# Patient Record
Sex: Female | Born: 1990 | Race: White | Hispanic: Yes | Marital: Single | Smoking: Never smoker
Health system: Southern US, Community
[De-identification: ages and names within clinical notes are randomized; demographics above are authoritative.]

## PROBLEM LIST (undated history)

## (undated) DIAGNOSIS — Z789 Other specified health status: Secondary | ICD-10-CM

## (undated) HISTORY — PX: NO PAST SURGERIES: SHX2092

---

## 2020-02-26 LAB — OB RESULTS CONSOLE GC/CHLAMYDIA
Chlamydia: NEGATIVE
Gonorrhea: NEGATIVE

## 2020-02-26 LAB — OB RESULTS CONSOLE RPR: RPR: NONREACTIVE

## 2020-02-26 LAB — OB RESULTS CONSOLE RUBELLA ANTIBODY, IGM: Rubella: IMMUNE

## 2020-02-26 LAB — OB RESULTS CONSOLE HIV ANTIBODY (ROUTINE TESTING): HIV: NONREACTIVE

## 2020-02-26 LAB — OB RESULTS CONSOLE HEPATITIS B SURFACE ANTIGEN: Hepatitis B Surface Ag: NEGATIVE

## 2020-02-29 ENCOUNTER — Inpatient Hospital Stay (HOSPITAL_COMMUNITY): Admit: 2020-02-29 | Payer: 59 | Admitting: Obstetrics and Gynecology

## 2020-06-14 NOTE — L&D Delivery Note (Signed)
Delivery Note At 11:31 AM a viable female was delivered via Vaginal, Spontaneous (Presentation:   Occiput Anterior).  APGAR: 9, 9; weight  .   Placenta status: Spontaneous, Intact.  Cord: 3 vessels with the following complications: None.  Cord pH: n/a  Anesthesia: Epidural but not working well Episiotomy: None Lacerations:  1st Suture Repair: 3.0 vicryl rapide 2 separate figure of 8 sutures Est. Blood Loss (mL):  150cc  Mom to postpartum.  Baby to Couplet care / Skin to Skin.  Lendon Colonel 09/14/2020, 11:50 AM

## 2020-09-13 ENCOUNTER — Inpatient Hospital Stay (HOSPITAL_COMMUNITY)
Admission: AD | Admit: 2020-09-13 | Discharge: 2020-09-16 | DRG: 807 | Disposition: A | Discharge status: Home / Self Care | Payer: Medicaid Other | Attending: Obstetrics | Admitting: Obstetrics

## 2020-09-13 ENCOUNTER — Other Ambulatory Visit: Payer: Self-pay

## 2020-09-13 ENCOUNTER — Encounter (HOSPITAL_COMMUNITY): Payer: Self-pay | Admitting: Obstetrics

## 2020-09-13 DIAGNOSIS — O4202 Full-term premature rupture of membranes, onset of labor within 24 hours of rupture: Secondary | ICD-10-CM

## 2020-09-13 DIAGNOSIS — O4292 Full-term premature rupture of membranes, unspecified as to length of time between rupture and onset of labor: Principal | ICD-10-CM | POA: Diagnosis present

## 2020-09-13 DIAGNOSIS — Z3A38 38 weeks gestation of pregnancy: Secondary | ICD-10-CM

## 2020-09-13 DIAGNOSIS — Z3A39 39 weeks gestation of pregnancy: Secondary | ICD-10-CM | POA: Diagnosis not present

## 2020-09-13 DIAGNOSIS — Z20822 Contact with and (suspected) exposure to covid-19: Secondary | ICD-10-CM | POA: Diagnosis present

## 2020-09-13 HISTORY — DX: Other specified health status: Z78.9

## 2020-09-13 MED ORDER — LACTATED RINGERS IV SOLN: 500.0000 mL | INTRAVENOUS | Status: DC | PRN

## 2020-09-13 NOTE — MAU Provider Note (Signed)
S: Ms. Mahnoor Mathisen is a 30 y.o. G2P0010 at [redacted]w[redacted]d  who presents to MAU today complaining of leaking of fluid since 7pm after experiencing a "pop." She denies vaginal bleeding. She reports irregular contractions. She reports normal fetal movement.    O: BP 125/84 (BP Location: Right Arm)   Pulse 95   Temp 98.2 F (36.8 C) (Oral)   Resp 16   LMP  (Exact Date)   SpO2 100%  GENERAL: Well-developed, well-nourished female in no acute distress.  HEAD: Normocephalic, atraumatic.  CHEST: Normal effort of breathing, regular heart rate ABDOMEN: Soft, nontender, gravid PELVIC: Normal external female genitalia. Vagina is pink and rugated. Cervix with normal contour, no lesions. Normal discharge.  Neg pooling. Fern Collected.  Cervical exam:  Dilation: 1.5 Effacement (%): 60 Cervical Position: Posterior Station: -2 Presentation: Vertex Exam by:: DFM   Fetal Monitoring: FHT: 135 bpm, Mod Var, -Decels, +Accels Toco: Occasional ctx with irritability  No results found for this or any previous visit (from the past 24 hour(s)).   A: SIUP at [redacted]w[redacted]d  Cat I FT SROM  P: Fern positive Report given to RN to contact MD on call for further instructions.  Gerrit Heck, CNM 09/13/2020 11:25 PM

## 2020-09-13 NOTE — MAU Note (Signed)
Pt presents to MAU with c/o questionable ROM.  Pt reports that she felt a trickle of fluid around 7pm and had some red spots on tissue when wiped.  Pt reports that she heard a "pop".  +FM

## 2020-09-14 ENCOUNTER — Inpatient Hospital Stay (HOSPITAL_COMMUNITY): Payer: Medicaid Other | Admitting: Anesthesiology

## 2020-09-14 ENCOUNTER — Encounter (HOSPITAL_COMMUNITY): Payer: Self-pay | Admitting: Obstetrics

## 2020-09-14 LAB — TYPE AND SCREEN
ABO/RH(D): AB POS
Antibody Screen: NEGATIVE

## 2020-09-14 LAB — CBC
HCT: 43.1 % (ref 36.0–46.0)
Hemoglobin: 14.8 g/dL (ref 12.0–15.0)
MCH: 29.8 pg (ref 26.0–34.0)
MCHC: 34.3 g/dL (ref 30.0–36.0)
MCV: 86.9 fL (ref 80.0–100.0)
Platelets: 191 10*3/uL (ref 150–400)
RBC: 4.96 MIL/uL (ref 3.87–5.11)
RDW: 13.2 % (ref 11.5–15.5)
WBC: 8.4 10*3/uL (ref 4.0–10.5)
nRBC: 0 % (ref 0.0–0.2)

## 2020-09-14 LAB — RESP PANEL BY RT-PCR (FLU A&B, COVID) ARPGX2
Influenza A by PCR: NEGATIVE
Influenza B by PCR: NEGATIVE
SARS Coronavirus 2 by RT PCR: NEGATIVE

## 2020-09-14 LAB — RPR: RPR Ser Ql: NONREACTIVE

## 2020-09-14 MED ORDER — SIMETHICONE 80 MG PO CHEW: 80.0000 mg | CHEWABLE_TABLET | ORAL | Status: DC | PRN

## 2020-09-14 MED ORDER — OXYCODONE-ACETAMINOPHEN 5-325 MG PO TABS
2.0000 | ORAL_TABLET | ORAL | Status: DC | PRN
Start: 2020-09-14 — End: 2020-09-14

## 2020-09-14 MED ORDER — ONDANSETRON HCL 4 MG PO TABS: 4.0000 mg | ORAL_TABLET | ORAL | Status: DC | PRN

## 2020-09-14 MED ORDER — ACETAMINOPHEN 325 MG PO TABS: 650.0000 mg | ORAL_TABLET | ORAL | Status: DC | PRN

## 2020-09-14 MED ORDER — SENNOSIDES-DOCUSATE SODIUM 8.6-50 MG PO TABS
2.0000 | ORAL_TABLET | ORAL | Status: DC
  Filled 2020-09-14 (×2): qty 2

## 2020-09-14 MED ORDER — EPHEDRINE 5 MG/ML INJ: 10.0000 mg | INTRAVENOUS | Status: DC | PRN

## 2020-09-14 MED ORDER — BUPIVACAINE HCL (PF) 0.25 % IJ SOLN
INTRAMUSCULAR | Status: DC | PRN
  Administered 2020-09-14: 10 mL via EPIDURAL
  Administered 2020-09-14: 8 mL via EPIDURAL

## 2020-09-14 MED ORDER — OXYCODONE-ACETAMINOPHEN 5-325 MG PO TABS: 1.0000 | ORAL_TABLET | ORAL | Status: DC | PRN

## 2020-09-14 MED ORDER — ONDANSETRON HCL 4 MG/2ML IJ SOLN: 4.0000 mg | Freq: Four times a day (QID) | INTRAMUSCULAR | Status: DC | PRN

## 2020-09-14 MED ORDER — COCONUT OIL OIL
1.0000 "application " | TOPICAL_OIL | Status: DC | PRN
  Administered 2020-09-16: 1 via TOPICAL

## 2020-09-14 MED ORDER — PRENATAL MULTIVITAMIN CH
1.0000 | ORAL_TABLET | Freq: Every day | ORAL | Status: DC
  Administered 2020-09-15: 1 via ORAL
  Filled 2020-09-14: qty 1

## 2020-09-14 MED ORDER — ACETAMINOPHEN 325 MG PO TABS
650.0000 mg | ORAL_TABLET | ORAL | Status: DC | PRN
  Administered 2020-09-14: 650 mg via ORAL
  Filled 2020-09-14: qty 2

## 2020-09-14 MED ORDER — WITCH HAZEL-GLYCERIN EX PADS: 1.0000 "application " | MEDICATED_PAD | CUTANEOUS | Status: DC | PRN

## 2020-09-14 MED ORDER — ZOLPIDEM TARTRATE 5 MG PO TABS: 5.0000 mg | ORAL_TABLET | Freq: Every evening | ORAL | Status: DC | PRN

## 2020-09-14 MED ORDER — SOD CITRATE-CITRIC ACID 500-334 MG/5ML PO SOLN: 30.0000 mL | ORAL | Status: DC | PRN

## 2020-09-14 MED ORDER — DIBUCAINE (PERIANAL) 1 % EX OINT: 1.0000 "application " | TOPICAL_OINTMENT | CUTANEOUS | Status: DC | PRN

## 2020-09-14 MED ORDER — ONDANSETRON HCL 4 MG/2ML IJ SOLN: 4.0000 mg | INTRAMUSCULAR | Status: DC | PRN

## 2020-09-14 MED ORDER — FENTANYL-BUPIVACAINE-NACL 0.5-0.125-0.9 MG/250ML-% EP SOLN
12.0000 mL/h | EPIDURAL | Status: DC | PRN
  Administered 2020-09-14: 14 mL/h via EPIDURAL
  Filled 2020-09-14: qty 250

## 2020-09-14 MED ORDER — OXYCODONE HCL 5 MG PO TABS
10.0000 mg | ORAL_TABLET | ORAL | Status: DC | PRN
Start: 2020-09-14 — End: 2020-09-16

## 2020-09-14 MED ORDER — FENTANYL CITRATE (PF) 100 MCG/2ML IJ SOLN: 50.0000 ug | INTRAMUSCULAR | Status: DC | PRN

## 2020-09-14 MED ORDER — LIDOCAINE HCL (PF) 1 % IJ SOLN
30.0000 mL | INTRAMUSCULAR | Status: AC | PRN
  Administered 2020-09-14: 30 mL via SUBCUTANEOUS
  Filled 2020-09-14: qty 30

## 2020-09-14 MED ORDER — PHENYLEPHRINE 40 MCG/ML (10ML) SYRINGE FOR IV PUSH (FOR BLOOD PRESSURE SUPPORT): 80.0000 ug | PREFILLED_SYRINGE | INTRAVENOUS | Status: DC | PRN

## 2020-09-14 MED ORDER — OXYCODONE HCL 5 MG PO TABS: 5.0000 mg | ORAL_TABLET | ORAL | Status: DC | PRN

## 2020-09-14 MED ORDER — IBUPROFEN 600 MG PO TABS
600.0000 mg | ORAL_TABLET | Freq: Four times a day (QID) | ORAL | Status: DC
  Administered 2020-09-14 – 2020-09-16 (×7): 600 mg via ORAL
  Filled 2020-09-14 (×7): qty 1

## 2020-09-14 MED ORDER — DIPHENHYDRAMINE HCL 50 MG/ML IJ SOLN: 12.5000 mg | INTRAMUSCULAR | Status: DC | PRN

## 2020-09-14 MED ORDER — MISOPROSTOL 25 MCG QUARTER TABLET
25.0000 ug | ORAL_TABLET | ORAL | Status: DC | PRN
  Administered 2020-09-14 (×2): 25 ug via BUCCAL
  Filled 2020-09-14 (×2): qty 1

## 2020-09-14 MED ORDER — LIDOCAINE HCL (PF) 1 % IJ SOLN
INTRAMUSCULAR | Status: DC | PRN
  Administered 2020-09-14: 10 mL via EPIDURAL

## 2020-09-14 MED ORDER — MISOPROSTOL 25 MCG QUARTER TABLET: 25.0000 ug | ORAL_TABLET | ORAL | Status: DC

## 2020-09-14 MED ORDER — TETANUS-DIPHTH-ACELL PERTUSSIS 5-2.5-18.5 LF-MCG/0.5 IM SUSY: 0.5000 mL | PREFILLED_SYRINGE | Freq: Once | INTRAMUSCULAR | Status: DC

## 2020-09-14 MED ORDER — TERBUTALINE SULFATE 1 MG/ML IJ SOLN: 0.2500 mg | Freq: Once | INTRAMUSCULAR | Status: DC | PRN

## 2020-09-14 MED ORDER — LACTATED RINGERS IV SOLN
500.0000 mL | Freq: Once | INTRAVENOUS | Status: AC
  Administered 2020-09-14: 500 mL via INTRAVENOUS

## 2020-09-14 MED ORDER — OXYTOCIN-SODIUM CHLORIDE 30-0.9 UT/500ML-% IV SOLN
2.5000 [IU]/h | INTRAVENOUS | Status: DC
  Filled 2020-09-14: qty 500

## 2020-09-14 MED ORDER — LACTATED RINGERS IV SOLN: INTRAVENOUS | Status: DC

## 2020-09-14 MED ORDER — OXYTOCIN BOLUS FROM INFUSION
333.0000 mL | Freq: Once | INTRAVENOUS | Status: AC
  Administered 2020-09-14: 333 mL via INTRAVENOUS

## 2020-09-14 MED ORDER — DIPHENHYDRAMINE HCL 25 MG PO CAPS: 25.0000 mg | ORAL_CAPSULE | Freq: Four times a day (QID) | ORAL | Status: DC | PRN

## 2020-09-14 MED ORDER — BENZOCAINE-MENTHOL 20-0.5 % EX AERO
1.0000 | INHALATION_SPRAY | CUTANEOUS | Status: DC | PRN
Start: 2020-09-14 — End: 2020-09-16
  Administered 2020-09-14: 1 via TOPICAL
  Filled 2020-09-14: qty 56

## 2020-09-14 MED ORDER — FLEET ENEMA 7-19 GM/118ML RE ENEM: 1.0000 | ENEMA | RECTAL | Status: DC | PRN

## 2020-09-14 MED ORDER — PHENYLEPHRINE 40 MCG/ML (10ML) SYRINGE FOR IV PUSH (FOR BLOOD PRESSURE SUPPORT)
80.0000 ug | PREFILLED_SYRINGE | INTRAVENOUS | Status: DC | PRN
  Filled 2020-09-14: qty 10

## 2020-09-14 NOTE — Lactation Note (Signed)
This note was copied from a baby's chart. Lactation Consultation Note  Patient Name: Boy Niquita Digioia [id number] Date: 09/14/2020 Reason for consult: Initial assessment;1st time breastfeeding;Term Age:28 hours P1, term female infant currently not latching at breast. Infant had one stool since birth. Mom is breastfeeding and supplementing with donor breast milk. Mom made few attempts to latch infant at the breast using the football hold position,  infant would latch but only held breast in mouth,  would not suckle. LC discussed hand expression and mom taught back infant was given 4 mls of colostrum by spoon.  LC did little suck training, infant took 5 mls of donor breast milk on LC's glove finger with cure tip syringe. Mom will continue to work towards latching infant at the breast, mom knows to call RN or [facility] for latch assistance.  Mom knows to breastfeed infant according to cues, 8 to 12 or more times within 24 hours, STS. LC discussed infant's input and output with parents. Mom was given hand pump by RN, mom knows she can pre-pump breast to help evert nipple shaft out more, prior to latch infant.  Mom made aware of O/P services, breastfeeding support groups, community resources, and our phone # for post-discharge questions.  Maternal Data Has patient been taught Hand Expression?: Yes Does the patient have breastfeeding experience prior to this delivery?: No  Feeding Mother's Current Feeding Choice: Breast Milk and Donor Milk  LATCH Score Latch: Repeated attempts needed to sustain latch, nipple held in mouth throughout feeding, stimulation needed to elicit sucking reflex.  Audible Swallowing: None  Type of Nipple: Everted at rest and after stimulation  Comfort (Breast/Nipple): Soft / non-tender  Hold (Positioning): Assistance needed to correctly position infant at breast and maintain latch.  LATCH Score: 6   Lactation Tools Discussed/Used Tools: Pump Breast pump type: Manual Pump  Education: Setup, frequency, and cleaning;Milk Storage Reason for Pumping: Mom will pre-pump breast prior to latching infant. Pumping frequency: pre-pump breast prior to latching infant  Interventions Interventions: Breast feeding basics reviewed;Assisted with latch;Skin to skin;Breast massage;Hand express;Pre-pump if needed;Adjust position;Support pillows;Position options;Expressed milk;Hand pump;Education  Discharge Pump: Personal;Manual WIC Program: Yes  Consult Status Consult Status: Follow-up Date: 09/15/20 Follow-up type: In-patient    Danelle Earthly 09/14/2020, 9:52 PM

## 2020-09-14 NOTE — Anesthesia Procedure Notes (Signed)
Epidural Patient location during procedure: OB Start time: 09/14/2020 9:15 AM End time: 09/14/2020 9:35 AM  Staffing Anesthesiologist: Mellody Dance, MD Performed: anesthesiologist   Preanesthetic Checklist Completed: patient identified, IV checked, site marked, risks and benefits discussed, monitors and equipment checked, pre-op evaluation and timeout performed  Epidural Patient position: sitting Prep: DuraPrep Patient monitoring: heart rate, cardiac monitor, continuous pulse ox and blood pressure Approach: midline Location: L2-L3 Injection technique: LOR saline  Needle:  Needle type: Tuohy  Needle gauge: 17 G Needle length: 9 cm Needle insertion depth: 8 cm Catheter type: closed end flexible Catheter size: 20 Guage Catheter at skin depth: 13 cm Test dose: negative and Other  Assessment Events: blood not aspirated, injection not painful, no injection resistance and negative IV test  Additional Notes Informed consent obtained prior to proceeding including risk of failure, 1% risk of PDPH, risk of minor discomfort and bruising.  Discussed rare but serious complications including epidural abscess, permanent nerve injury, epidural hematoma.  Discussed alternatives to epidural analgesia and patient desires to proceed.  Timeout performed pre-procedure verifying patient name, procedure, and platelet count.  Patient tolerated procedure well.

## 2020-09-14 NOTE — H&P (Signed)
Diamond Jones is a 30 y.o. G2P0010 at [redacted]w[redacted]d presenting for rupture of membranes. Pt notes no contractions upon arrival to the hospital. Good fetal movement, No vaginal bleeding, started leaking fluid at 7 PM on September 13, 2020, presented to MAU for evaluation at 11 PM.  [facility] wks -Dated by 7-week ultrasound -Uncomplicated pregnancy -Declined genetic screening   Prenatal Transfer Tool  Maternal Diabetes: No Genetic Screening: Normal Maternal Ultrasounds/Referrals: Normal Fetal Ultrasounds or other Referrals:  None Maternal Substance Abuse:  No Significant Maternal Medications:  None Significant Maternal Lab Results: Group B Strep negative     OB History    Gravida  2   Para  0   Term  0   Preterm  0   AB  1   Living  0     SAB  1   IAB  0   Ectopic  0   Multiple  0   Live Births  0          Past Medical History:  Diagnosis Date  . Medical history non-contributory    Past Surgical History:  Procedure Laterality Date  . NO PAST SURGERIES     Family History: family history is not on file. Social History:  reports that she has never smoked. She has never used smokeless tobacco. She reports that she does not drink alcohol and does not use drugs.  Review of Systems - Negative except Uncomfortable contractions now status post 2 doses of Cytotec and entering active labor   Dilation: 5 Effacement (%): 90 Station: 0 Exam by:: Dr. Ernestina Penna Blood pressure 113/65, pulse 72, temperature 98.7 F (37.1 C), temperature source Oral, resp. rate 20, height 5\' 3"  (1.6 m), weight 86.4 kg, SpO2 100 %.  Physical Exam:  Gen: well appearing, no distress, uncomfortable with contractions   Abd: gravid, NT, no RUQ pain LE: Trace edema, equal bilaterally, non-tender Toco: Every 2 FH: baseline 120s, accelerations present, no deceleratons, 10 beat variability  Prenatal labs: ABO, Rh: --/--/AB POS (04/03 0825) Antibody: NEG (04/03  0825) Rubella: Immune (09/14 0000) RPR: Nonreactive (09/14 0000)  HBsAg: Negative (09/14 0000)  HIV: Non-reactive (09/14 0000)  GBS:   Negative 1 hr Glucola 92  Genetic screening declined Anatomy 11-06-2004 normal   Assessment/Plan: 30 y.o. G2P0010 at [redacted]w[redacted]d Premature rupture membranes at 39 weeks, now in active labor after 2 dose of Pitocin GBS negative Reactive fetal testing Continue to work with anesthesia on adequate pain control   [redacted]w[redacted]d 09/14/2020, 11:00 AM

## 2020-09-14 NOTE — MAU Note (Signed)
Pt up to bathroom.

## 2020-09-14 NOTE — Anesthesia Preprocedure Evaluation (Signed)
Anesthesia Evaluation  Patient identified by MRN, date of birth, ID band Patient awake    Reviewed: Allergy & Precautions, Patient's Chart, lab work & pertinent test results  Airway Mallampati: II  TM Distance: >3 FB Neck ROM: Full    Dental no notable dental hx.    Pulmonary neg pulmonary ROS,    Pulmonary exam normal breath sounds clear to auscultation       Cardiovascular negative cardio ROS Normal cardiovascular exam Rhythm:Regular Rate:Normal     Neuro/Psych negative neurological ROS  negative psych ROS   GI/Hepatic negative GI ROS, Neg liver ROS,   Endo/Other  negative endocrine ROS  Renal/GU negative Renal ROS  negative genitourinary   Musculoskeletal negative musculoskeletal ROS (+)   Abdominal   Peds negative pediatric ROS (+)  Hematology negative hematology ROS (+)   Anesthesia Other Findings   Reproductive/Obstetrics (+) Pregnancy                             Anesthesia Physical Anesthesia Plan  ASA: II  Anesthesia Plan: Epidural   Post-op Pain Management:    Induction:   PONV Risk Score and Plan: 2 and Treatment may vary due to age or medical condition  Airway Management Planned: Natural Airway  Additional Equipment:   Intra-op Plan:   Post-operative Plan:   Informed Consent: I have reviewed the patients History and Physical, chart, labs and discussed the procedure including the risks, benefits and alternatives for the proposed anesthesia with the patient or authorized representative who has indicated his/her understanding and acceptance.       Plan Discussed with: Anesthesiologist  Anesthesia Plan Comments:         Anesthesia Quick Evaluation

## 2020-09-14 NOTE — Anesthesia Postprocedure Evaluation (Signed)
Anesthesia Post Note  Patient: Diamond Jones  Procedure(s) Performed: AN AD HOC LABOR EPIDURAL     Patient location during evaluation: Mother Baby Anesthesia Type: Epidural Level of consciousness: awake and alert Pain management: pain level controlled Vital Signs Assessment: post-procedure vital signs reviewed and stable Respiratory status: spontaneous breathing, nonlabored ventilation and respiratory function stable Cardiovascular status: stable Postop Assessment: no headache, no backache and epidural receding Anesthetic complications: no   No complications documented.  Last Vitals:  Vitals:   09/14/20 1432 09/14/20 1812  BP: 116/75 119/70  Pulse: 71 88  Resp: 18 18  Temp: 36.4 C 36.8 C  SpO2: 99% 100%    Last Pain:  Vitals:   09/14/20 1812  TempSrc: Oral  PainSc: 0-No pain   Pain Goal:                Epidural/Spinal Function Cutaneous sensation: Normal sensation (09/14/20 1812), Patient able to flex knees: Yes (09/14/20 1812), Patient able to lift hips off bed: Yes (09/14/20 1812), Back pain beyond tenderness at insertion site: No (09/14/20 1812), Progressively worsening motor and/or sensory loss: No (09/14/20 1812), Bowel and/or bladder incontinence post epidural: No (09/14/20 1812)  Rica Records

## 2020-09-14 NOTE — MAU Note (Signed)
Pt transferred to LD room 205 via wheelchair

## 2020-09-14 NOTE — Lactation Note (Signed)
This note was copied from a baby's chart. Lactation Consultation Note  Patient Name: Diamond Jones [id number] Date: 09/14/2020 Reason for consult: L&D Initial assessment;Primapara;Term Age:28 hours  Mom reported experiencing breast tenderness with pregnancy. Breast cup size did not change, although Mom reported seeing some stretch marks.  Latch attempted a number of times with infant with Mom in upright & side-lying position, but without success (except for a few suckles). Infant noted to have a heart-shaped tongue on extension; Mom's nipples appear flat at this time.  Lurline Hare [facility] 09/14/2020, 12:31 PM

## 2020-09-15 NOTE — Progress Notes (Signed)
PPD # 1 S/P NSVD  Live born female  Birth Weight: 6 lb 3.1 oz (2809 g) APGAR: 8, 9  Newborn Delivery   Birth date/time: 09/14/2020 11:31:00 Delivery type: Vaginal, Spontaneous     Baby name: Diamond Jones Delivering provider: Noland Fordyce   Episiotomy:None   Lacerations:1st degree   Circumcision Yes, planning  Feeding: breast  Pain control at delivery: Epidural   Subjective   Reports feeling well with no complaints. Breastfeeding going well. Bleeding is light and pain is well controlled. Husband at the bedside.              Tolerating po/ No nausea or vomiting             Bleeding is light             Pain controlled with acetaminophen and ibuprofen (OTC)             Up ad lib / ambulatory / voiding without difficulties   Objective   A & O x 3, in no apparent distress  Vitals:   09/14/20 1812 09/14/20 2200 09/15/20 0220 09/15/20 0520  BP: 119/70 109/76 118/74 103/72  Pulse: 88 79 83 76  Resp: 18 17 18 18   Temp: 98.3 F (36.8 C) 97.9 F (36.6 C) 98 F (36.7 C) 97.6 F (36.4 C)  TempSrc: Oral Oral Oral Oral  SpO2: 100% 100% 99% 100%  Weight:      Height:       Recent Labs    09/13/20 2342  WBC 8.4  HGB 14.8  HCT 43.1  PLT 191    Blood type: --/--/AB POS (04/03 0825)  Rubella: Immune (09/14 0000)   Vaccines:   TDaP   UTD                   Flu       UTD                             COVID-19 UTD   Gen: AAO x 3, NAD  Abdomen: soft, non-tender, non-distended             Fundus: firm, non-tender, U-1  Perineum: repair intact  Lochia: small  Extremities: no edema, no calf pain or tenderness   Assessment/Plan PPD # 1 30 y.o., 11-06-2002   Principal Problem:   Postpartum care following vaginal delivery 4/3 Active Problems:   Normal labor   SVD 4/3   First degree perineal laceration   Doing well - stable status  Routine post partum orders  Discussed perineal care and comfort measures  Desires early discharge today if baby is discharged. Otherwise, anticipate  discharge tomorrow.   6/3, MSN, CNM 09/15/2020, 10:07 AM

## 2020-09-15 NOTE — Lactation Note (Signed)
This note was copied from a baby's chart. Lactation Consultation Note  Patient Name: Diamond Jones Date: 09/15/2020   Age:28 hours  LC talked with RN, Dixie Dials, states infant doing better following visit with lactation this afternoon. Does not need follow up at this time.  Maternal Data    Feeding    LATCH Score                    Lactation Tools Discussed/Used    Interventions    Discharge    Consult Status      Diamond Jones  [name] 09/15/2020, 8:29 PM

## 2020-09-16 MED ORDER — ACETAMINOPHEN 500 MG PO TABS: 1000.0000 mg | ORAL_TABLET | Freq: Four times a day (QID) | ORAL | 2 refills | Status: DC | PRN

## 2020-09-16 MED ORDER — IBUPROFEN 600 MG PO TABS: 600.0000 mg | ORAL_TABLET | Freq: Four times a day (QID) | ORAL | 0 refills | Status: DC

## 2020-09-16 MED ORDER — COCONUT OIL OIL: 1.0000 "application " | TOPICAL_OIL | 0 refills | Status: DC | PRN

## 2020-09-16 MED ORDER — BENZOCAINE-MENTHOL 20-0.5 % EX AERO: 1.0000 "application " | INHALATION_SPRAY | CUTANEOUS | Status: DC | PRN

## 2020-09-16 NOTE — Lactation Note (Signed)
This note was copied from a baby's chart. Lactation Consultation Note  Patient Name: Diamond Jones [id number] Date: 09/16/2020 Reason for consult: Follow-up assessment;Primapara;1st time breastfeeding;Term;Infant weight loss Age:28 hours/ P 1 , 6 % weight loss at 41 hours - Bili check - 10 - low intermediate.  As LC entered the room . Baby latched on the right breast with a #20 NS and  Feeding - swallows noted, baby fed 12 mins. 2nd breast mom latched without the NS and baby fed 10 mins/ swallows and per mom comfortable.  Mom aware if she is consistently using the NS to latch post pumping is indicated at least after 5-6 feedings  Both breast for 10 -15 mins.  Once the milk is in and not using the NS and weight is ok , extra pumping is not needed.  Latch score - 9 x 2 .  See below for D/C education.  Mom already has comfort gels for after feedings or pumping  And LC provided shells for alternating with comfort gels while awake.  Mom has the [facility] brochure with resources.     Maternal Data    Feeding Mother's Current Feeding Choice: Breast Milk  LATCH Score Latch: Grasps breast easily, tongue down, lips flanged, rhythmical sucking.  Audible Swallowing: A few with stimulation  Type of Nipple: Everted at rest and after stimulation  Comfort (Breast/Nipple): Soft / non-tender  Hold (Positioning): No assistance needed to correctly position infant at breast.  LATCH Score: 9   Lactation Tools Discussed/Used Tools: Shells;Pump;Flanges Nipple shield size: 20 Flange Size: 24 Breast pump type: Manual;Double-Electric Breast Pump Pump Education: Milk Storage Reason for Pumping: per mom pumped x1 Pumped volume: 4 mL  Interventions Interventions: Breast feeding basics reviewed;Education  Discharge Discharge Education: Engorgement and breast care;Warning signs for feeding baby Pump: Personal;Manual;DEBP  Consult Status Consult Status: Complete Date: 09/16/20    Diamond Sprang  [name] 09/16/2020, 9:12 AM

## 2020-09-16 NOTE — Discharge Summary (Signed)
OB Discharge Summary  Patient Name: Diamond Jones DOB: [birth year] MRN: [id number]  Date of admission: 09/13/2020 Delivering provider: Noland Fordyce   Admitting diagnosis: Normal labor [O80, Z37.9] Intrauterine pregnancy: [redacted]w[redacted]d     Secondary diagnosis: Patient Active Problem List   Diagnosis Date Noted  . SVD 4/3 09/15/2020  . Postpartum care following vaginal delivery 4/3 09/15/2020  . First degree perineal laceration 09/15/2020  . Normal labor 09/13/2020   Additional problems:none   Date of discharge: 09/16/2020   Discharge diagnosis: Principal Problem:   Postpartum care following vaginal delivery 4/3 Active Problems:   Normal labor   SVD 4/3   First degree perineal laceration                                                              Post partum procedures:none  Augmentation: Cytotec Pain control: Epidural  Laceration:1st degree  Episiotomy:None  Complications: None  Hospital course:  Onset of Labor With Vaginal Delivery      30 y.o. yo G2P1011 at [redacted]w[redacted]d was admitted in Latent Labor on 09/13/2020. Patient had an uncomplicated labor course as follows:  Membrane Rupture Time/Date: 7:00 PM ,09/13/2020   Delivery Method:Vaginal, Spontaneous  Episiotomy: None  Lacerations:  1st degree  Patient had an uncomplicated postpartum course.  She is ambulating, tolerating a regular diet, passing flatus, and urinating well. Patient is discharged home in stable condition on 09/16/20.  Newborn Data: Birth date:09/14/2020  Birth time:11:31 AM  Gender:Female  Living status:Living  Apgars:8 ,9  Weight:2809 g   Physical exam  Vitals:   09/15/20 0220 09/15/20 0520 09/15/20 2157 09/16/20 0530  BP: 118/74 103/72 100/66 115/77  Pulse: 83 76 76 72  Resp: 18 18 18 18   Temp: 98 F (36.7 C) 97.6 F (36.4 C) (!) 97.4 F (36.3 C) (!) 97.5 F (36.4 C)  TempSrc: Oral Oral Oral Oral  SpO2: 99% 100% 100% 100%  Weight:      Height:       General: alert, cooperative and no distress Lochia:  appropriate Uterine Fundus: firm Incision: N/A Perineum: repair intact, no edema DVT Evaluation: No significant calf/ankle edema. Labs: Lab Results  Component Value Date   WBC 8.4 09/13/2020   HGB 14.8 09/13/2020   HCT 43.1 09/13/2020   MCV 86.9 09/13/2020   PLT 191 09/13/2020   No flowsheet data found. Edinburgh Postnatal Depression Scale Screening Tool 09/15/2020  I have been able to laugh and see the funny side of things. 0  I have looked forward with enjoyment to things. 0  I have blamed myself unnecessarily when things went wrong. 0  I have been anxious or worried for no good reason. 0  I have felt scared or panicky for no good reason. 0  Things have been getting on top of me. 0  I have been so unhappy that I have had difficulty sleeping. 0  I have felt sad or miserable. 0  I have been so unhappy that I have been crying. 0  The thought of harming myself has occurred to me. 0  Edinburgh Postnatal Depression Scale Total 0   Vaccines: TDaP          UTD         Flu  UTD                    COVID-19 UTD  Discharge instruction:  per After Visit Summary,  [facility] OB booklet and  "Understanding Mother & Baby Care" hospital booklet  After Visit Meds:  Allergies as of 09/16/2020   No Known Allergies     Medication List    TAKE these medications   acetaminophen 500 MG tablet Commonly known as: TYLENOL Take 2 tablets (1,000 mg total) by mouth every 6 (six) hours as needed.   benzocaine-Menthol 20-0.5 % Aero Commonly known as: DERMOPLAST Apply 1 application topically as needed for irritation (perineal discomfort).   coconut oil Oil Apply 1 application topically as needed.   ibuprofen 600 MG tablet Commonly known as: ADVIL Take 1 tablet (600 mg total) by mouth every 6 (six) hours.   PRENATAL VITAMINS PO Take 1 tablet by mouth daily.            Discharge Care Instructions  (From admission, onward)         Start     Ordered   09/16/20 0000   Discharge wound care:       Comments: Sitz baths 2 times /day with warm water x 1 week. May add herbals: 1 ounce dried comfrey leaf* 1 ounce calendula flowers 1 ounce lavender flowers  Supplies can be found online at [employer] sources at [employer], [employer]  1/2 ounce dried uva ursi leaves 1/2 ounce witch hazel blossoms (if you can find them) 1/2 ounce dried sage leaf 1/2 cup sea salt Directions: Bring 2 quarts of water to a boil. Turn off heat, and place 1 ounce (approximately 1 large handful) of the above mixed herbs (not the salt) into the pot. Steep, covered, for 30 minutes.  Strain the liquid well with a fine mesh strainer, and discard the herb material. Add 2 quarts of liquid to the tub, along with the 1/2 cup of salt. This medicinal liquid can also be made into compresses and peri-rinses.   09/16/20 1054          Diet: routine diet  Activity: Advance as tolerated. Pelvic rest for 6 weeks.   Postpartum contraception: Not Discussed  Newborn Data: Live born female  Birth Weight: 6 lb 3.1 oz (2809 g) APGAR: 8, 9  Newborn Delivery   Birth date/time: 09/14/2020 11:31:00 Delivery type: Vaginal, Spontaneous      named Logan Baby Feeding: Breast Disposition:home with mother Circumcision: completed inpatient / Dr. Juliene Pina   Delivery Report:  Review the Delivery Report for details.    Follow up:  Follow-up Information    Amado Nash Candace Gallus, MD. Schedule an appointment as soon as possible for a visit in 6 week(s).   Specialty: Obstetrics and Gynecology Contact information: [address] [place] [address] [phone]                 Signed: Cipriano Mile, MSN 09/16/2020, 10:55 AM

## 2020-09-16 NOTE — Progress Notes (Incomplete)
OB Discharge Summary  Patient Name: Diamond Jones DOB: 1990/10/26 MRN: [id number]  Date of admission: 09/13/2020 Delivering provider: Noland Fordyce   Admitting diagnosis: Normal labor [O80, Z37.9] Intrauterine pregnancy: [redacted]w[redacted]d     Secondary diagnosis: Patient Active Problem List   Diagnosis Date Noted  . SVD 4/3 09/15/2020  . Postpartum care following vaginal delivery 4/3 09/15/2020  . First degree perineal laceration 09/15/2020  . Normal labor 09/13/2020   Additional problems:***   Date of discharge: 09/16/2020   Discharge diagnosis: Principal Problem:   Postpartum care following vaginal delivery 4/3 Active Problems:   Normal labor   SVD 4/3   First degree perineal laceration                                                              Post partum procedures:{Postpartum procedures:23558}  Augmentation: {Augmentation:20782} Pain control: Epidural  Laceration:1st degree  Episiotomy:None  Complications: {OB Labor/Delivery Complications:20784}  Hospital course:  {Courses:23701}  Physical exam  Vitals:   09/15/20 0220 09/15/20 0520 09/15/20 2157 09/16/20 0530  BP: 118/74 103/72 100/66 115/77  Pulse: 83 76 76 72  Resp: 18 18 18 18   Temp: 98 F (36.7 C) 97.6 F (36.4 C) (!) 97.4 F (36.3 C) (!) 97.5 F (36.4 C)  TempSrc: Oral Oral Oral Oral  SpO2: 99% 100% 100% 100%  Weight:      Height:       General: {Exam; general:21111117} Lochia: {Desc; appropriate/inappropriate:30686::"appropriate"} Uterine Fundus: {Desc; firm/soft:30687} Incision: {Exam; incision:21111123} Perineum: repair ***, *** edema DVT Evaluation: {Exam; dvt:2111122} Labs: Lab Results  Component Value Date   WBC 8.4 09/13/2020   HGB 14.8 09/13/2020   HCT 43.1 09/13/2020   MCV 86.9 09/13/2020   PLT 191 09/13/2020   No flowsheet data found. Edinburgh Postnatal Depression Scale Screening Tool 09/15/2020  I have been able to laugh and see the funny side of things. 0  I have looked forward with  enjoyment to things. 0  I have blamed myself unnecessarily when things went wrong. 0  I have been anxious or worried for no good reason. 0  I have felt scared or panicky for no good reason. 0  Things have been getting on top of me. 0  I have been so unhappy that I have had difficulty sleeping. 0  I have felt sad or miserable. 0  I have been so unhappy that I have been crying. 0  The thought of harming myself has occurred to me. 0  Edinburgh Postnatal Depression Scale Total 0   Vaccines: TDaP          ***         Flu             ***                    COVID-19 ***  Discharge instruction:  per After Visit Summary,  [facility] OB booklet and  "Understanding Mother & Baby Care" hospital booklet  After Visit Meds:  Allergies as of 09/16/2020   No Known Allergies   Med Rec must be completed prior to using this SMARTLINK***       Diet: {OB diet:21111121}  Activity: Advance as tolerated. Pelvic rest for 6 weeks.   Postpartum contraception: {Contraceptives:21111124}  Newborn Data: Live born  female  Birth Weight: 6 lb 3.1 oz (2809 g) APGAR: 8, 9  Newborn Delivery   Birth date/time: 09/14/2020 11:31:00 Delivery type: Vaginal, Spontaneous      named *** Baby Feeding: {Baby feeding:23562} Disposition:{CHL IP OB HOME WITH CNOBSJ:62836}   Delivery Report:  Review the Delivery Report for details.    Follow up:     Shantonette Danella Deis) Suzie Portela, BSN, RNC-OB  Student Nurse-Midwife   09/16/2020  8:07 AM

## 2020-09-16 NOTE — Discharge Instructions (Signed)
Lactation outpatient support - home visit  Linda Coppola RN, MHA, IBCLC at [facility]: Lactation Consultant  [email or web address] Mail: [email or web address] Tel: [phone]    Additional resources:  [facility] [email or web address]   Chiropractic specialist   Dr. Leanna Hastings [email or web address]  Craniosacral therapy for baby  Erin Balkind  [email or web address]  

## 2020-10-24 ENCOUNTER — Ambulatory Visit (INDEPENDENT_AMBULATORY_CARE_PROVIDER_SITE_OTHER): Payer: Medicaid Other | Admitting: Obstetrics & Gynecology

## 2020-10-24 ENCOUNTER — Other Ambulatory Visit: Payer: Self-pay

## 2020-10-24 DIAGNOSIS — Z30011 Encounter for initial prescription of contraceptive pills: Secondary | ICD-10-CM

## 2020-10-24 MED ORDER — NORGESTIMATE-ETH ESTRADIOL 0.25-35 MG-MCG PO TABS: 1.0000 | ORAL_TABLET | Freq: Every day | ORAL | 11 refills | Status: DC

## 2020-10-24 NOTE — Progress Notes (Signed)
Post Partum Visit Note  Diamond Jones is a 30 y.o. G82P1011 female who presents for a postpartum visit. She is 5 weeks postpartum following a normal spontaneous vaginal delivery.  I have fully reviewed the prenatal and intrapartum course. The delivery was at 39 gestational weeks.  Anesthesia: epidural. Postpartum course has been uncomplicated. Baby is doing well. Baby is feeding by both breast and bottle - Enfamil Gentle Ease . Bleeding staining only. Bowel function is normal. Bladder function is normal. Patient is not sexually active. Contraception method is OCP (estrogen/progesterone). Postpartum depression screening: negative.  Of note, patient had her entire prenatal care and delivery done by [facility].  But her insurance changed, and she was told she had to come to [place] for postpartum follow up.   The pregnancy intention screening data noted above was reviewed. Potential methods of contraception were discussed. The patient elected to proceed with Oral Contraceptive.    Edinburgh Postnatal Depression Scale - 10/24/20 1001      Edinburgh Postnatal Depression Scale:  In the Past 7 Days   I have been able to laugh and see the funny side of things. 0    I have looked forward with enjoyment to things. 0    I have blamed myself unnecessarily when things went wrong. 0    I have been anxious or worried for no good reason. 0    I have felt scared or panicky for no good reason. 0    Things have been getting on top of me. 0    I have been so unhappy that I have had difficulty sleeping. 0    I have felt sad or miserable. 0    I have been so unhappy that I have been crying. 0    The thought of harming myself has occurred to me. 0    Edinburgh Postnatal Depression Scale Total 0          The following portions of the patient's history were reviewed and updated as appropriate: allergies, current medications, past family history, past medical history, past social history, past surgical history  and problem list.  Review of Systems Pertinent items noted in HPI and remainder of comprehensive ROS otherwise negative.  Objective:  BP 106/73   Pulse 74   Wt 169 lb (76.7 kg)   BMI 29.94 kg/m    General:  alert and no distress   Breasts:  normal  Lungs: clear to auscultation bilaterally  Heart:  regular rate and rhythm  Abdomen: soft, non-tender; bowel sounds normal; no masses,  no organomegaly   Pelvic:  not indicated       Assessment:  Normal postpartum exam.   Plan:   Essential components of care per ACOG recommendations:  1.  Mood and well being: Patient with negative depression screening today. Reviewed local resources for support.  - Patient tobacco use? No.   - hx of drug use? No.    2. Infant care and feeding:  -Patient currently breastmilk feeding? Yes. Reviewed importance of draining breast regularly to support lactation.  -Social determinants of health (SDOH) reviewed in EPIC. No concerns.  3. Sexuality, contraception and birth spacing - Patient does not want a pregnancy in the next year.  Desired family size is 1 children.  - Reviewed forms of contraception in tiered fashion. Patient desired oral contraceptives (estrogen/progesterone) today.  Sprintec prescribed, has taken in the past. Will return in 2 months for OCP/BP check. Understands that estrogen can reduce breast milk  amount, she declined progestin only pills or LARCs. - Discussed birth spacing of 18 months  4. Sleep and fatigue -Encouraged family/partner/community support of 4 hrs of uninterrupted sleep to help with mood and fatigue  5. Physical Recovery  - Discussed patients delivery and complications. She describes her labor as good. - Patient had a Vaginal, no problems at delivery. Patient had a 1st degree laceration. Perineal healing reviewed. Patient expressed understanding - Patient has urinary incontinence? No. - Patient is safe to resume physical and sexual activity  6.  Health  Maintenance - HM due items addressed Yes - Last pap smear normal at [facility] on 02/26/2020 (results under Media)  -Breast Cancer screening indicated? No.    I spent 25 minutes dedicated to the care of this patient including pre-visit review of records, face to face time with the patient discussing her antenatal, intrapartum and postpartum course and ordering appropriate birth control and follow up.    Jaynie Collins, MD [facility] [employer], [facility]

## 2020-12-26 ENCOUNTER — Ambulatory Visit (INDEPENDENT_AMBULATORY_CARE_PROVIDER_SITE_OTHER): Payer: Medicaid Other | Admitting: Obstetrics & Gynecology

## 2020-12-26 ENCOUNTER — Encounter: Payer: Self-pay | Admitting: Obstetrics & Gynecology

## 2020-12-26 ENCOUNTER — Other Ambulatory Visit: Payer: Self-pay

## 2020-12-26 VITALS — BP 118/72 | HR 66 | Wt 170.0 lb

## 2020-12-26 DIAGNOSIS — Z3009 Encounter for other general counseling and advice on contraception: Secondary | ICD-10-CM | POA: Diagnosis not present

## 2020-12-26 NOTE — Progress Notes (Signed)
History:  30 y.o. G2P1011 here today for OCP check. Pt with no complaints. She reports that 2 days after starting the pills, her breast milk stopped. She reportst that she planned to stop nursing anyway. She is taking the pills daily and is not having problems.    The following portions of the patient's history were reviewed and updated as appropriate: allergies, current medications, past family history, past medical history, past social history, past surgical history and problem list.  Review of Systems:  Pertinent items are noted in HPI.    Objective:  Physical Exam Blood pressure 118/72, pulse 66, weight 170 lb (77.1 kg), unknown if currently breastfeeding.  CONSTITUTIONAL: Well-developed, well-nourished female in no acute distress.  HENT:  Normocephalic, atraumatic EYES: Conjunctivae and EOM are normal. No scleral icterus.  NECK: Normal range of motion SKIN: Skin is warm and dry. No rash noted. Not diaphoretic.No pallor. NEUROLGIC: Alert and oriented to person, place, and time. Normal coordination.  Pelvic: not done    Assessment & Plan:  OCPs check. Pt with no problems Rec f/u in 1 year for annual or sooner prn  Rashawna Scoles L. Harraway-Smith, M.D., [place]

## 2021-05-29 ENCOUNTER — Other Ambulatory Visit: Payer: Self-pay

## 2021-05-29 ENCOUNTER — Encounter: Payer: Self-pay | Admitting: Family Medicine

## 2021-05-29 ENCOUNTER — Ambulatory Visit: Payer: Medicaid Other

## 2021-05-29 ENCOUNTER — Ambulatory Visit (INDEPENDENT_AMBULATORY_CARE_PROVIDER_SITE_OTHER): Payer: Medicaid Other | Admitting: Family Medicine

## 2021-05-29 ENCOUNTER — Other Ambulatory Visit (HOSPITAL_COMMUNITY)
Admission: RE | Admit: 2021-05-29 | Discharge: 2021-05-29 | Disposition: A | Discharge status: Home / Self Care | Payer: Medicaid Other | Source: Ambulatory Visit | Attending: Family Medicine | Admitting: Family Medicine

## 2021-05-29 VITALS — BP 109/73 | HR 76 | Wt 164.0 lb

## 2021-05-29 DIAGNOSIS — O09891 Supervision of other high risk pregnancies, first trimester: Secondary | ICD-10-CM

## 2021-05-29 DIAGNOSIS — Z3A10 10 weeks gestation of pregnancy: Secondary | ICD-10-CM | POA: Diagnosis not present

## 2021-05-29 DIAGNOSIS — O09899 Supervision of other high risk pregnancies, unspecified trimester: Secondary | ICD-10-CM | POA: Insufficient documentation

## 2021-05-29 DIAGNOSIS — Z3480 Encounter for supervision of other normal pregnancy, unspecified trimester: Secondary | ICD-10-CM | POA: Insufficient documentation

## 2021-05-29 MED ORDER — BLOOD PRESSURE CUFF MISC: 1.0000 | 0 refills | Status: DC

## 2021-05-29 NOTE — Progress Notes (Signed)
Subjective:  Diamond Jones is a G3P1011 [redacted]w[redacted]d by LMP being seen today for her first obstetrical visit.  Her obstetrical history is significant for  previous normal SVD at term. She had a low risk pregnancy . Her last pregnancy ended in April of 2022. This was an unintended pregnancy, but desired. FOB is patients spouse. Changing practice due to insurance. Patient  is uncertain about  breast feeding. Pregnancy history fully reviewed.  Patient reports no complaints.  BP 109/73    Pulse 76    Wt 164 lb 0.6 oz (74.4 kg)    LMP 03/19/2021 (Approximate)    BMI 29.06 kg/m   HISTORY: OB History  Gravida Para Term Preterm AB Living  3 1 1  0 1 1  SAB IAB Ectopic Multiple Live Births  1 0 0 0 1    # Outcome Date GA Lbr Len/2nd Weight Sex Delivery Anes PTL Lv  3 Current           2 Term 09/14/20 [redacted]w[redacted]d 16:16 / 00:15 6 lb 3.1 oz (2.809 kg) M Vag-Spont EPI  LIV  1 SAB             Past Medical History:  Diagnosis Date   Medical history non-contributory     Past Surgical History:  Procedure Laterality Date   NO PAST SURGERIES      No family history on file.   Exam  BP 109/73    Pulse 76    Wt 164 lb 0.6 oz (74.4 kg)    LMP 03/19/2021 (Approximate)    BMI 29.06 kg/m   Chaperone present during exam  CONSTITUTIONAL: Well-developed, well-nourished female in no acute distress.  HENT:  Normocephalic, atraumatic, External right and left ear normal. Oropharynx is clear and moist EYES: Conjunctivae and EOM are normal. Pupils are equal, round, and reactive to light. No scleral icterus.  NECK: Normal range of motion, supple, no masses.  Normal thyroid.  CARDIOVASCULAR: Normal heart rate noted, regular rhythm RESPIRATORY: Clear to auscultation bilaterally. Effort and breath sounds normal, no problems with respiration noted. BREASTS: Symmetric in size. No masses, skin changes, nipple drainage, or lymphadenopathy. ABDOMEN: Soft, normal bowel sounds, no distention noted.  No tenderness, rebound or  guarding.  PELVIC: Normal appearing external genitalia; normal appearing vaginal mucosa and cervix.  MUSCULOSKELETAL: Normal range of motion. No tenderness.  No cyanosis, clubbing, or edema.  2+ distal pulses. SKIN: Skin is warm and dry. No rash noted. Not diaphoretic. No erythema. No pallor. NEUROLOGIC: Alert and oriented to person, place, and time. Normal reflexes, muscle tone coordination. No cranial nerve deficit noted. PSYCHIATRIC: Normal mood and affect. Normal behavior. Normal judgment and thought content.    Assessment:    Pregnancy: G3P1011 Patient Active Problem List   Diagnosis Date Noted   Supervision of other normal pregnancy, antepartum 05/29/2021      Plan:   1. Supervision of other normal pregnancy, antepartum FHT on 05/31/2021 168. Initial labs obtained Continue prenatal vitamins Reviewed n/v relief measures and warning s/s to report Reviewed recommended weight gain based on pre-gravid BMI Encouraged well-balanced diet Genetic & carrier screening discussed: declines Panorama,  Ultrasound discussed; fetal survey: requested CCNC completed> form faxed if has or is planning to apply for medicaid The nature of [facility] [place] with multiple MDs and other Advanced Practice Providers was explained to patient; also emphasized that fellows, residents, and students are part of our team.  Does not meet criteria for early GDM testing or  ASA 81mg . - CBC/D/Plt+RPR+Rh+ABO+RubIgG... - Culture, OB Urine - Cytology - PAP( [facility]) - Enroll Patient in PreNatal Babyscripts - Babyscripts Schedule Optimization - MFM OB DETAIL +14 WK; Future - Blood Pressure Monitoring (BLOOD PRESSURE CUFF) MISC; 1 Device by Does not apply route once a week.  Dispense: 1 each; Refill: 0  2. Short interval between pregnancies affecting pregnancy, antepartum Discussed increased risk of SGA baby. Emphasis on dietary supplementation - [place] MFM OB DETAIL +14 WK; Future      Problem list reviewed and updated. 75% of 30 min visit spent on counseling and coordination of care.     [place] 05/29/2021

## 2021-05-30 LAB — CBC/D/PLT+RPR+RH+ABO+RUBIGG...
Antibody Screen: NEGATIVE
Basophils Absolute: 0.1 10*3/uL (ref 0.0–0.2)
Basos: 1 %
EOS (ABSOLUTE): 0.1 10*3/uL (ref 0.0–0.4)
Eos: 1 %
HCV Ab: <0.1 s/co ratio (ref 0.0–0.9)
HIV Screen 4th Generation wRfx: NONREACTIVE
Hematocrit: 42.9 % (ref 34.0–46.6)
Hemoglobin: 14.5 g/dL (ref 11.1–15.9)
Hepatitis B Surface Ag: NEGATIVE
Immature Grans (Abs): 0 10*3/uL (ref 0.0–0.1)
Immature Granulocytes: 0 %
Lymphocytes Absolute: 1.4 10*3/uL (ref 0.7–3.1)
Lymphs: 19 %
MCH: 29.4 pg (ref 26.6–33.0)
MCHC: 33.8 g/dL (ref 31.5–35.7)
MCV: 87 fL (ref 79–97)
Monocytes Absolute: 0.4 10*3/uL (ref 0.1–0.9)
Monocytes: 6 %
Neutrophils Absolute: 5.2 10*3/uL (ref 1.4–7.0)
Neutrophils: 73 %
Platelets: 256 10*3/uL (ref 150–450)
RBC: 4.93 x10E6/uL (ref 3.77–5.28)
RDW: 12.8 % (ref 11.7–15.4)
RPR Ser Ql: NONREACTIVE
Rh Factor: POSITIVE
Rubella Antibodies, IGG: 6.9 index (ref >0.99)
WBC: 7.2 10*3/uL (ref 3.4–10.8)

## 2021-05-30 LAB — HCV INTERPRETATION

## 2021-05-31 LAB — URINE CULTURE, OB REFLEX: Organism ID, Bacteria: NO GROWTH

## 2021-05-31 LAB — CULTURE, OB URINE

## 2021-06-02 LAB — CYTOLOGY - PAP
Chlamydia: NEGATIVE
Comment: NEGATIVE
Comment: NORMAL
Diagnosis: NEGATIVE
Neisseria Gonorrhea: NEGATIVE

## 2021-06-14 NOTE — L&D Delivery Note (Addendum)
Delivery Note Diamond Jones is a 31 y.o. G3P1011 at [redacted]w[redacted]d admitted for elective IOL at term.   GBS Status: Negative/-- (06/14 1050) Maximum Maternal Temperature: 98.3  Labor course: Initial SVE: 2/50/-3. Augmentation with: Cytotec and IP Foley. She then progressed to complete.   ROM: 2h 27m with clear fluid  Birth: At [phone] a viable female was delivered via spontaneous vaginal delivery (Presentation: ROA  ). Nuchal cord present: No.  Shoulders and body delivered in usual fashion. Infant placed directly on mom's abdomen for bonding/skin-to-skin, baby dried and stimulated. Cord clamped x 2 after 1 minute and cut by FOB.  Cord blood collected.  The placenta separated spontaneously and delivered via gentle cord traction.  Pitocin infused rapidly IV per protocol.  Fundus firm with massage.  Placenta inspected and appears to be intact with a 3 VC.  Placenta/Cord with the following complications: None.  Sponge and instrument count were correct x2.  Intrapartum complications:  None Anesthesia:  epidural Episiotomy: none Lacerations:  N/A (small peri-urethral abrasion that was hemostatic)  Suture Repair: N/A EBL (mL): 100   Infant: APGAR (1 MIN): 8   APGAR (5 MINS): 9   Infant weight: pending  Mom to postpartum.  Baby to Couplet care / Skin to Skin. Placenta to L&D   Plans to [facility] Contraception:  Considering IUD  but unsure at this time.  Circumcision: wants inpatient  Note sent to [facility]: [facility] [place] for pp visit.  Raylene Everts, MD 12/21/2021 9:36 AM  ATTESTATION  I was present, gloved, and supervising throughout the delivery and agree with above documentation in the resident's note.  Allayne Stack, DO OB Fellow  [facility] [employer] (Faculty Practice) 12/21/2021, 9:52 AM

## 2021-06-25 ENCOUNTER — Other Ambulatory Visit: Payer: Self-pay

## 2021-06-25 ENCOUNTER — Ambulatory Visit (INDEPENDENT_AMBULATORY_CARE_PROVIDER_SITE_OTHER): Payer: Medicaid Other | Admitting: Obstetrics & Gynecology

## 2021-06-25 VITALS — BP 99/69 | HR 84 | Wt 163.0 lb

## 2021-06-25 DIAGNOSIS — Z3480 Encounter for supervision of other normal pregnancy, unspecified trimester: Secondary | ICD-10-CM

## 2021-06-25 DIAGNOSIS — Z3A14 14 weeks gestation of pregnancy: Secondary | ICD-10-CM

## 2021-06-25 NOTE — Progress Notes (Signed)
° °  PRENATAL VISIT NOTE  Subjective:  Diamond Jones is a 31 y.o. G3P1011 at [redacted]w[redacted]d being seen today for ongoing prenatal care.  She is currently monitored for the following issues for this low-risk pregnancy and has Supervision of other normal pregnancy, antepartum and Short interval between pregnancies affecting pregnancy, antepartum on their problem list.  Patient reports no complaints.  Contractions: Not present. Vag. Bleeding: None.  Movement: Absent. Denies leaking of fluid.   The following portions of the patient's history were reviewed and updated as appropriate: allergies, current medications, past family history, past medical history, past social history, past surgical history and problem list.   Objective:   Vitals:   06/25/21 1445  BP: 99/69  Pulse: 84  Weight: 163 lb (73.9 kg)    Fetal Status: Fetal Heart Rate (bpm): 155   Movement: Absent     General:  Alert, oriented and cooperative. Patient is in no acute distress.  Skin: Skin is warm and dry. No rash noted.   Cardiovascular: Normal heart rate noted  Respiratory: Normal respiratory effort, no problems with respiration noted  Abdomen: Soft, gravid, appropriate for gestational age.  Pain/Pressure: Absent     Pelvic: Cervical exam deferred        Extremities: Normal range of motion.  Edema: None  Mental Status: Normal mood and affect. Normal behavior. Normal judgment and thought content.   Assessment and Plan:  Pregnancy: G3P1011 at [redacted]w[redacted]d 1. [redacted] weeks gestation of pregnancy Pt is on optimized scheudle BRx.  Did not have cuff.  Weight is flowing.  Pt will return at 20 and 28 weeks and take BP and Wt weekly.   2. Supervision of other normal pregnancy, antepartum Declines genetic testing Anatomy on Feb 16th scheduled.   Preterm labor symptoms and general obstetric precautions including but not limited to vaginal bleeding, contractions, leaking of fluid and fetal movement were reviewed in detail with the patient. Please  refer to After Visit Summary for other counseling recommendations.   No follow-ups on file.  Future Appointments  Date Time Provider Department Center  07/23/2021  1:50 PM Levie Heritage, DO [facility] None  07/30/2021 10:00 AM [facility] NURSE [facility]  07/30/2021 10:15 AM [facility] US2 [facility]    Elsie Lincoln, MD

## 2021-07-23 ENCOUNTER — Encounter: Payer: Medicaid Other | Admitting: Family Medicine

## 2021-07-30 ENCOUNTER — Ambulatory Visit: Payer: Medicaid Other | Attending: Obstetrics and Gynecology

## 2021-07-30 ENCOUNTER — Encounter: Payer: Self-pay | Admitting: *Deleted

## 2021-07-30 ENCOUNTER — Other Ambulatory Visit: Payer: Self-pay

## 2021-07-30 ENCOUNTER — Ambulatory Visit: Payer: Medicaid Other | Admitting: *Deleted

## 2021-07-30 ENCOUNTER — Ambulatory Visit (INDEPENDENT_AMBULATORY_CARE_PROVIDER_SITE_OTHER): Payer: Medicaid Other | Admitting: Family Medicine

## 2021-07-30 ENCOUNTER — Other Ambulatory Visit: Payer: Self-pay | Admitting: *Deleted

## 2021-07-30 VITALS — BP 111/69 | HR 71 | Wt 167.0 lb

## 2021-07-30 VITALS — BP 116/67 | HR 86

## 2021-07-30 DIAGNOSIS — O321XX0 Maternal care for breech presentation, not applicable or unspecified: Secondary | ICD-10-CM | POA: Insufficient documentation

## 2021-07-30 DIAGNOSIS — O09892 Supervision of other high risk pregnancies, second trimester: Secondary | ICD-10-CM

## 2021-07-30 DIAGNOSIS — Z3480 Encounter for supervision of other normal pregnancy, unspecified trimester: Secondary | ICD-10-CM

## 2021-07-30 DIAGNOSIS — O358XX0 Maternal care for other (suspected) fetal abnormality and damage, not applicable or unspecified: Secondary | ICD-10-CM | POA: Insufficient documentation

## 2021-07-30 DIAGNOSIS — Z363 Encounter for antenatal screening for malformations: Secondary | ICD-10-CM | POA: Insufficient documentation

## 2021-07-30 DIAGNOSIS — Z3A19 19 weeks gestation of pregnancy: Secondary | ICD-10-CM

## 2021-07-30 DIAGNOSIS — O09899 Supervision of other high risk pregnancies, unspecified trimester: Secondary | ICD-10-CM

## 2021-07-30 NOTE — Progress Notes (Signed)
° °  PRENATAL VISIT NOTE  Subjective:  Diamond Jones is a 31 y.o. G3P1011 at [redacted]w[redacted]d being seen today for ongoing prenatal care.  She is currently monitored for the following issues for this low-risk pregnancy and has Supervision of other normal pregnancy, antepartum and Short interval between pregnancies affecting pregnancy, antepartum on their problem list.  Patient reports no complaints.  Contractions: Not present. Vag. Bleeding: None.  Movement: Present. Denies leaking of fluid.   The following portions of the patient's history were reviewed and updated as appropriate: allergies, current medications, past family history, past medical history, past social history, past surgical history and problem list.   Objective:   Vitals:   07/30/21 0835  BP: 111/69  Pulse: 71  Weight: 167 lb (75.8 kg)    Fetal Status: Fetal Heart Rate (bpm): 143   Movement: Present     General:  Alert, oriented and cooperative. Patient is in no acute distress.  Skin: Skin is warm and dry. No rash noted.   Cardiovascular: Normal heart rate noted  Respiratory: Normal respiratory effort, no problems with respiration noted  Abdomen: Soft, gravid, appropriate for gestational age.  Pain/Pressure: Absent     Pelvic: Cervical exam deferred        Extremities: Normal range of motion.  Edema: None  Mental Status: Normal mood and affect. Normal behavior. Normal judgment and thought content.   Assessment and Plan:  Pregnancy: G3P1011 at [redacted]w[redacted]d 1. [redacted] weeks gestation of pregnancy  2. Supervision of other normal pregnancy, antepartum FHT and FH normal. BP normal. Weight gain normal [place] today  3. Short interval between pregnancies affecting pregnancy, antepartum   Preterm labor symptoms and general obstetric precautions including but not limited to vaginal bleeding, contractions, leaking of fluid and fetal movement were reviewed in detail with the patient. Please refer to After Visit Summary for other counseling  recommendations.   No follow-ups on file.  Future Appointments  Date Time Provider [place]  07/30/2021 10:00 AM [facility] NURSE [facility]  07/30/2021 10:15 AM [facility] US2 [facility] [place], DO

## 2021-08-15 ENCOUNTER — Encounter: Payer: Self-pay | Admitting: Family Medicine

## 2021-08-27 ENCOUNTER — Ambulatory Visit: Payer: Medicaid Other | Attending: Obstetrics

## 2021-08-27 ENCOUNTER — Other Ambulatory Visit: Payer: Self-pay

## 2021-08-27 ENCOUNTER — Ambulatory Visit: Payer: Medicaid Other | Admitting: *Deleted

## 2021-08-27 VITALS — BP 116/66 | HR 78

## 2021-08-27 DIAGNOSIS — O09892 Supervision of other high risk pregnancies, second trimester: Secondary | ICD-10-CM | POA: Insufficient documentation

## 2021-08-27 DIAGNOSIS — Z362 Encounter for other antenatal screening follow-up: Secondary | ICD-10-CM

## 2021-10-01 ENCOUNTER — Ambulatory Visit (INDEPENDENT_AMBULATORY_CARE_PROVIDER_SITE_OTHER): Payer: Medicaid Other | Admitting: Family Medicine

## 2021-10-01 ENCOUNTER — Encounter: Payer: Self-pay | Admitting: General Practice

## 2021-10-01 VITALS — BP 104/63 | HR 73 | Wt 174.0 lb

## 2021-10-01 DIAGNOSIS — F419 Anxiety disorder, unspecified: Secondary | ICD-10-CM

## 2021-10-01 DIAGNOSIS — Z3A28 28 weeks gestation of pregnancy: Secondary | ICD-10-CM

## 2021-10-01 DIAGNOSIS — F32A Depression, unspecified: Secondary | ICD-10-CM

## 2021-10-01 DIAGNOSIS — Z23 Encounter for immunization: Secondary | ICD-10-CM

## 2021-10-01 DIAGNOSIS — O09899 Supervision of other high risk pregnancies, unspecified trimester: Secondary | ICD-10-CM

## 2021-10-01 DIAGNOSIS — Z3480 Encounter for supervision of other normal pregnancy, unspecified trimester: Secondary | ICD-10-CM

## 2021-10-01 NOTE — Progress Notes (Signed)
? ?  PRENATAL VISIT NOTE ? ?Subjective:  ?Diamond Jones is a 31 y.o. G3P1011 at [redacted]w[redacted]d being seen today for ongoing prenatal care.  She is currently monitored for the following issues for this low-risk pregnancy and has Supervision of other normal pregnancy, antepartum and Short interval between pregnancies affecting pregnancy, antepartum on their problem list. ? ?Patient reports  increased anxiety. Had PP depression and hasn't quite recovered, particularly because of the short interval of pregnancy. Sees a Social worker. No family near-by .  Contractions: Not present. Vag. Bleeding: None.  Movement: Present. Denies leaking of fluid.  ? ?The following portions of the patient's history were reviewed and updated as appropriate: allergies, current medications, past family history, past medical history, past social history, past surgical history and problem list.  ? ?Objective:  ? ?Vitals:  ? 10/01/21 0839  ?BP: 104/63  ?Pulse: 73  ?Weight: 174 lb (78.9 kg)  ? ? ?Fetal Status: Fetal Heart Rate (bpm): 142   Movement: Present    ? ?General:  Alert, oriented and cooperative. Patient is in no acute distress.  ?Skin: Skin is warm and dry. No rash noted.   ?Cardiovascular: Normal heart rate noted  ?Respiratory: Normal respiratory effort, no problems with respiration noted  ?Abdomen: Soft, gravid, appropriate for gestational age.  Pain/Pressure: Present     ?Pelvic: Cervical exam deferred        ?Extremities: Normal range of motion.  Edema: None  ?Mental Status: Normal mood and affect. Normal behavior. Normal judgment and thought content.  ? ?Assessment and Plan:  ?Pregnancy: G3P1011 at [redacted]w[redacted]d ?1. [redacted] weeks gestation of pregnancy ?- Glucose Tolerance, 2 Hours w/1 Hour ?- RPR ?- HIV antibody (with reflex) ?- CBC ? ?2. Supervision of other normal pregnancy, antepartum ?FHT and FH normal ?- Glucose Tolerance, 2 Hours w/1 Hour ?- RPR ?- HIV antibody (with reflex) ?- CBC ? ?3. Short interval between pregnancies affecting pregnancy,  antepartum ? ?4. Anxiety and depression ?Discussed strategies, including medication management, finding time for herself, continuing with counseling. Also discussed about close surveillance during postpartum period.  ? ?Preterm labor symptoms and general obstetric precautions including but not limited to vaginal bleeding, contractions, leaking of fluid and fetal movement were reviewed in detail with the patient. ?Please refer to After Visit Summary for other counseling recommendations.  ? ?No follow-ups on file. ? ?No future appointments. ? ?Truett Mainland, DO ? ?

## 2021-10-01 NOTE — Progress Notes (Signed)
ROB 28 wks ?GTT, CBC, HIV, RPR today ?Depression and anxiety screen positive, established with a therapist currently ?TDAP offered and accepted ?

## 2021-10-02 LAB — CBC
Hematocrit: 42.3 % (ref 34.0–46.6)
Hemoglobin: 14 g/dL (ref 11.1–15.9)
MCH: 30.1 pg (ref 26.6–33.0)
MCHC: 33.1 g/dL (ref 31.5–35.7)
MCV: 91 fL (ref 79–97)
Platelets: 211 10*3/uL (ref 150–450)
RBC: 4.65 x10E6/uL (ref 3.77–5.28)
RDW: 13.3 % (ref 11.7–15.4)
WBC: 7.4 10*3/uL (ref 3.4–10.8)

## 2021-10-02 LAB — RPR: RPR Ser Ql: NONREACTIVE

## 2021-10-02 LAB — GLUCOSE TOLERANCE, 2 HOURS W/ 1HR
Glucose, 1 hour: 133 mg/dL (ref 70–179)
Glucose, 2 hour: 120 mg/dL (ref 70–152)
Glucose, Fasting: 75 mg/dL (ref 70–91)

## 2021-10-02 LAB — HIV ANTIBODY (ROUTINE TESTING W REFLEX): HIV Screen 4th Generation wRfx: NONREACTIVE

## 2021-10-30 ENCOUNTER — Telehealth (INDEPENDENT_AMBULATORY_CARE_PROVIDER_SITE_OTHER): Payer: Medicaid Other | Admitting: Family Medicine

## 2021-10-30 VITALS — Wt 177.0 lb

## 2021-10-30 DIAGNOSIS — Z3A32 32 weeks gestation of pregnancy: Secondary | ICD-10-CM

## 2021-10-30 DIAGNOSIS — O09899 Supervision of other high risk pregnancies, unspecified trimester: Secondary | ICD-10-CM

## 2021-10-30 DIAGNOSIS — O09893 Supervision of other high risk pregnancies, third trimester: Secondary | ICD-10-CM

## 2021-10-30 DIAGNOSIS — Z3480 Encounter for supervision of other normal pregnancy, unspecified trimester: Secondary | ICD-10-CM

## 2021-10-30 NOTE — Progress Notes (Signed)
   OBSTETRICS PRENATAL VIRTUAL VISIT ENCOUNTER NOTE  Provider location: [facility] for [place] [facility]   Patient location: Home  I connected with Diamond Jones on 10/30/21 at 11:15 AM EDT by MyChart Video Encounter and verified that I am speaking with the correct person using two identifiers. I discussed the limitations, risks, security and privacy concerns of performing an evaluation and management service virtually and the availability of in person appointments. I also discussed with the patient that there may be a patient responsible charge related to this service. The patient expressed understanding and agreed to proceed. Subjective:  Diamond Jones is a 31 y.o. G3P1011 at [redacted]w[redacted]d being seen today for ongoing prenatal care.  She is currently monitored for the following issues for this low-risk pregnancy and has Supervision of other normal pregnancy, antepartum and Short interval between pregnancies affecting pregnancy, antepartum on their problem list.  Patient reports  vaginal pressure .  Contractions: Not present. Vag. Bleeding: None.  Movement: Present. Denies any leaking of fluid.   The following portions of the patient's history were reviewed and updated as appropriate: allergies, current medications, past family history, past medical history, past social history, past surgical history and problem list.   Objective:   Vitals:   10/30/21 1102  Weight: 177 lb (80.3 kg)    Fetal Status:     Movement: Present     General:  Alert, oriented and cooperative. Patient is in no acute distress.  Respiratory: Normal respiratory effort, no problems with respiration noted  Mental Status: Normal mood and affect. Normal behavior. Normal judgment and thought content.  Rest of physical exam deferred due to type of encounter  Imaging: No results found.  Assessment and Plan:  Pregnancy: G3P1011 at [redacted]w[redacted]d 1. Supervision of other normal pregnancy, antepartum FHT and FH  normal. Discussed contraception methods. THinking about IUD. Had increased anxiety and depression with COCs.  2. Short interval between pregnancies affecting pregnancy, antepartum   Preterm labor symptoms and general obstetric precautions including but not limited to vaginal bleeding, contractions, leaking of fluid and fetal movement were reviewed in detail with the patient. I discussed the assessment and treatment plan with the patient. The patient was provided an opportunity to ask questions and all were answered. The patient agreed with the plan and demonstrated an understanding of the instructions. The patient was advised to call back or seek an in-person office evaluation/go to MAU at [facility] for any urgent or concerning symptoms. Please refer to After Visit Summary for other counseling recommendations.   I provided 12 minutes of face-to-face time during this encounter.  No follow-ups on file.  Future Appointments  Date Time Provider [place]  11/25/2021  9:35 AM Truett Mainland, DO [facility] None  12/18/2021 11:15 AM Truett Mainland, DO [facility] None  12/24/2021  8:35 AM Anyanwu, Sallyanne Havers, MD [facility] None    [facility] for [employer], [place]

## 2021-11-25 ENCOUNTER — Ambulatory Visit (INDEPENDENT_AMBULATORY_CARE_PROVIDER_SITE_OTHER): Payer: Medicaid Other | Admitting: Family Medicine

## 2021-11-25 ENCOUNTER — Other Ambulatory Visit (HOSPITAL_COMMUNITY)
Admission: RE | Admit: 2021-11-25 | Discharge: 2021-11-25 | Disposition: A | Discharge status: Home / Self Care | Payer: Medicaid Other | Source: Ambulatory Visit | Attending: Family Medicine | Admitting: Family Medicine

## 2021-11-25 VITALS — BP 113/70 | HR 81 | Wt 185.0 lb

## 2021-11-25 DIAGNOSIS — Z3480 Encounter for supervision of other normal pregnancy, unspecified trimester: Secondary | ICD-10-CM | POA: Insufficient documentation

## 2021-11-25 DIAGNOSIS — O09899 Supervision of other high risk pregnancies, unspecified trimester: Secondary | ICD-10-CM

## 2021-11-25 DIAGNOSIS — O99891 Other specified diseases and conditions complicating pregnancy: Secondary | ICD-10-CM

## 2021-11-25 DIAGNOSIS — Z3A35 35 weeks gestation of pregnancy: Secondary | ICD-10-CM

## 2021-11-25 DIAGNOSIS — Z20828 Contact with and (suspected) exposure to other viral communicable diseases: Secondary | ICD-10-CM | POA: Insufficient documentation

## 2021-11-25 NOTE — Progress Notes (Signed)
   PRENATAL VISIT NOTE  Subjective:  Diamond Jones is a 31 y.o. G3P1011 at [redacted]w[redacted]d being seen today for ongoing prenatal care.  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  Patient reports no complaints. Son recently diagnosed with hand foot mouth. No cold symptoms.  Contractions: Not present. Vag. Bleeding: None.  Movement: Present. Denies leaking of fluid.   The following portions of the patient's history were reviewed and updated as appropriate: allergies, current medications, past family history, past medical history, past social history, past surgical history and problem list.   Objective:   Vitals:   11/25/21 0931  BP: 113/70  Pulse: 81  Weight: 185 lb (83.9 kg)    Fetal Status: Fetal Heart Rate (bpm): 143 Fundal Height: 35 cm Movement: Present  Presentation: Vertex  General:  Alert, oriented and cooperative. Patient is in no acute distress.  Skin: Skin is warm and dry. No rash noted.   Cardiovascular: Normal heart rate noted  Respiratory: Normal respiratory effort, no problems with respiration noted  Abdomen: Soft, gravid, appropriate for gestational age.  Pain/Pressure: Present     Pelvic: Cervical exam performed in the presence of a chaperone Dilation: Closed Effacement (%): Thick Station: Ballotable  Extremities: Normal range of motion.  Edema: None  Mental Status: Normal mood and affect. Normal behavior. Normal judgment and thought content.   Assessment and Plan:  Pregnancy: G3P1011 at [redacted]w[redacted]d 1. [redacted] weeks gestation of pregnancy  2. Supervision of other normal pregnancy, antepartum FHT and FH normal  3. Short interval between pregnancies affecting pregnancy, antepartum  4. 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  Discussed prevention of contact with son (sharing food, kissing, etc). If she develops cold symptoms, then may do antibody testing.   Preterm labor symptoms and general obstetric precautions including but not limited to vaginal bleeding, contractions, leaking of fluid and fetal movement were reviewed in detail with the patient. Please refer to After Visit Summary for other counseling recommendations.   No follow-ups on file.  Future Appointments  Date Time Provider Department Center  12/18/2021 11:15 AM Levie Heritage, DO [facility] None  12/24/2021  8:35 AM Anyanwu, Jethro Bastos, MD [facility] None    Levie Heritage, DO

## 2021-11-26 LAB — GC/CHLAMYDIA PROBE AMP (~~LOC~~) NOT AT ARMC
Chlamydia: NEGATIVE
Comment: NEGATIVE
Comment: NORMAL
Neisseria Gonorrhea: NEGATIVE

## 2021-11-29 LAB — CULTURE, BETA STREP (GROUP B ONLY): Strep Gp B Culture: NEGATIVE

## 2021-12-15 ENCOUNTER — Inpatient Hospital Stay (HOSPITAL_COMMUNITY)
Admission: AD | Admit: 2021-12-15 | Discharge: 2021-12-15 | Disposition: A | Discharge status: Home / Self Care | Payer: Medicaid Other | Attending: Obstetrics & Gynecology | Admitting: Obstetrics & Gynecology

## 2021-12-15 ENCOUNTER — Encounter (HOSPITAL_COMMUNITY): Payer: Self-pay | Admitting: Obstetrics & Gynecology

## 2021-12-15 ENCOUNTER — Other Ambulatory Visit: Payer: Self-pay

## 2021-12-15 DIAGNOSIS — R102 Pelvic and perineal pain: Secondary | ICD-10-CM | POA: Insufficient documentation

## 2021-12-15 DIAGNOSIS — N898 Other specified noninflammatory disorders of vagina: Secondary | ICD-10-CM | POA: Diagnosis not present

## 2021-12-15 DIAGNOSIS — Z833 Family history of diabetes mellitus: Secondary | ICD-10-CM | POA: Insufficient documentation

## 2021-12-15 DIAGNOSIS — O26893 Other specified pregnancy related conditions, third trimester: Secondary | ICD-10-CM | POA: Diagnosis present

## 2021-12-15 DIAGNOSIS — Z3A38 38 weeks gestation of pregnancy: Secondary | ICD-10-CM

## 2021-12-15 NOTE — MAU Provider Note (Signed)
Chief Complaint:  Pelvic Pain and Rupture of Membranes   Event Date/Time   First Provider Initiated Contact with Patient 12/15/21 1017     HPI: Diamond Jones is a 31 y.o. G3P1011 at 61w5dwho presents to maternity admissions reporting leaking of fluid in small amounts today.  Only requires a minipad every few hours. . She reports good fetal movement, denies vaginal bleeding, vaginal itching/burning, urinary symptoms, h/a, dizziness, n/v, diarrhea, constipation or fever/chills.   Pelvic Pain The patient's primary symptoms include pelvic pain and vaginal discharge. The patient's pertinent negatives include no genital itching or vaginal bleeding. This is a new problem. The current episode started today. The problem has been unchanged. The patient is experiencing no pain. She is pregnant. Pertinent negatives include no abdominal pain, constipation, diarrhea, dysuria or fever. The vaginal discharge was clear and thin. There has been no bleeding. She has not been passing clots. Nothing aggravates the symptoms. She has tried nothing for the symptoms.   RN Note: Diamond Jones is a 31 y.o. at [redacted]w[redacted]d here in MAU reporting: LOF for the past 2 days. States it has been very small amounts. Fluid is clear. Also having intermittent sharp pelvic pains. Denies bleeding. +FM  Past Medical History: Past Medical History:  Diagnosis Date   Medical history non-contributory     Past obstetric history: OB History  Gravida Para Term Preterm AB Living  3 1 1  0 1 1  SAB IAB Ectopic Multiple Live Births  1 0 0 0 1    # Outcome Date GA Lbr Len/2nd Weight Sex Delivery Anes PTL Lv  3 Current           2 Term 09/14/20 [redacted]w[redacted]d 16:16 / 00:15 2809 g M Vag-Spont EPI  LIV  1 SAB             Past Surgical History: Past Surgical History:  Procedure Laterality Date   NO PAST SURGERIES      Family History: Family History  Problem Relation Age of Onset   Diabetes Father    Diabetes Maternal Grandmother    Diabetes  Maternal Grandfather     Social History: Social History   Tobacco Use   Smoking status: Never   Smokeless tobacco: Never  Vaping Use   Vaping Use: Never used  Substance Use Topics   Alcohol use: Not Currently    Comment: rare   Drug use: Never    Allergies: No Known Allergies  Meds:  Medications Prior to Admission  Medication Sig Dispense Refill Last Dose   Prenatal Vit-Fe Fumarate-FA (PRENATAL MULTIVITAMIN) TABS tablet Take 1 tablet by mouth daily at 12 noon.   12/15/2021   Blood Pressure Monitoring (BLOOD PRESSURE CUFF) MISC 1 Device by Does not apply route once a week. 1 each 0     I have reviewed patient's Past Medical Hx, Surgical Hx, Family Hx, Social Hx, medications and allergies.   ROS:  Review of Systems  Constitutional:  Negative for fever.  Gastrointestinal:  Negative for abdominal pain, constipation and diarrhea.  Genitourinary:  Positive for pelvic pain and vaginal discharge. Negative for dysuria.   Other systems negative  Physical Exam  Patient Vitals for the past 24 hrs:  BP Temp Temp src Pulse Resp SpO2 Height Weight  12/15/21 1012 123/80 (!) 97.4 F (36.3 C) Oral 73 16 100 % -- --  12/15/21 1011 -- -- -- -- -- -- 5\' 3"  (1.6 m) 85.4 kg   Constitutional: Well-developed, well-nourished female in no acute distress.  Cardiovascular: normal rate and rhythm Respiratory: normal effort, clear to auscultation bilaterally GI: Abd soft, non-tender, gravid appropriate for gestational age.   No rebound or guarding. MS: Extremities nontender, no edema, normal ROM Neurologic: Alert and oriented x 4.  GU: Neg CVAT.  PELVIC EXAM: Cervix pink, visually closed, without lesion, scant white creamy discharge, vaginal walls and external genitalia normal    NO POOLING, NO FERNING  FHT:  Baseline 140 , moderate variability, accelerations present, no decelerations Contractions: Occasional, Irregular     Labs: No results found for this or any previous visit (from the past  24 hour(s)). AB/Positive/-- (12/16 0932)  Imaging:  No results found.  MAU Course/MDM: I have reviewed the triage vital signs and the nursing notes.   Pertinent labs & imaging results that were available during my care of the patient were reviewed by me and considered in my medical decision making (see chart for details).      I have reviewed her medical records including past results, notes and treatments.   NST reviewed, Reactive  Treatments in MAU included EFM, SSE.    Assessment: [place] IUP at [redacted]w[redacted]d Vaginal discharge in pregnancy, no evidence of ruptured membranes  Plan: Discharge home Labor precautions and fetal kick counts Follow up in Office for prenatal visits and recheck Encouraged to return if she develops worsening of symptoms, increase in pain, fever, or other concerning symptoms.   Pt stable at time of discharge.  Wynelle Bourgeois CNM, MSN Certified Nurse-Midwife 12/15/2021 10:18 AM

## 2021-12-15 NOTE — MAU Note (Signed)
Diamond Jones is a 31 y.o. at [redacted]w[redacted]d here in MAU reporting: LOF for the past 2 days. States it has been very small amounts. Fluid is clear. Also having intermittent sharp pelvic pains. Denies bleeding. +FM  Onset of complaint: 2 days  Pain score: 6/10  Vitals:   12/15/21 1012  BP: 123/80  Pulse: 73  Resp: 16  Temp: (!) 97.4 F (36.3 C)  SpO2: 100%     FHT: EFM applied in room  Lab orders placed from triage: fern

## 2021-12-18 ENCOUNTER — Ambulatory Visit (INDEPENDENT_AMBULATORY_CARE_PROVIDER_SITE_OTHER): Payer: Medicaid Other | Admitting: Family Medicine

## 2021-12-18 VITALS — BP 108/73 | HR 75 | Wt 189.0 lb

## 2021-12-18 DIAGNOSIS — Z3483 Encounter for supervision of other normal pregnancy, third trimester: Secondary | ICD-10-CM

## 2021-12-18 DIAGNOSIS — Z3A39 39 weeks gestation of pregnancy: Secondary | ICD-10-CM

## 2021-12-18 NOTE — Progress Notes (Signed)
   PRENATAL VISIT NOTE  Subjective:  Diamond Jones is a 31 y.o. G3P1011 at [redacted]w[redacted]d being seen today for ongoing prenatal care.  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  Patient reports no complaints.  Contractions: Not present. Vag. Bleeding: None.  Movement: Present. Denies leaking of fluid.   The following portions of the patient's history were reviewed and updated as appropriate: allergies, current medications, past family history, past medical history, past social history, past surgical history and problem list.   Objective:   Vitals:   12/18/21 1113  BP: 108/73  Pulse: 75  Weight: 189 lb (85.7 kg)    Fetal Status: Fetal Heart Rate (bpm): 143   Movement: Present  Presentation: Vertex  General:  Alert, oriented and cooperative. Patient is in no acute distress.  Skin: Skin is warm and dry. No rash noted.   Cardiovascular: Normal heart rate noted  Respiratory: Normal respiratory effort, no problems with respiration noted  Abdomen: Soft, gravid, appropriate for gestational age.  Pain/Pressure: Present     Pelvic: Cervical exam deferred        Extremities: Normal range of motion.  Edema: None  Mental Status: Normal mood and affect. Normal behavior. Normal judgment and thought content.   Assessment and Plan:  Pregnancy: G3P1011 at [redacted]w[redacted]d 1. [redacted] weeks gestation of pregnancy FHT and FH normal. Patient lives 40 minutes from hospital and her childcare is 30 minutes from her. Would like early induction in order to have child care. Patient was 2 cm in hospital - pitocin  Term labor symptoms and general obstetric precautions including but not limited to vaginal bleeding, contractions, leaking of fluid and fetal movement were reviewed in detail with the  patient. Please refer to After Visit Summary for other counseling recommendations.   No follow-ups on file.  No future appointments.  Levie Heritage, DO

## 2021-12-20 ENCOUNTER — Inpatient Hospital Stay (HOSPITAL_COMMUNITY): Payer: Medicaid Other

## 2021-12-20 ENCOUNTER — Other Ambulatory Visit: Payer: Self-pay

## 2021-12-20 ENCOUNTER — Inpatient Hospital Stay (HOSPITAL_COMMUNITY)
Admission: AD | Admit: 2021-12-20 | Discharge: 2021-12-22 | DRG: 807 | Disposition: A | Discharge status: Home / Self Care | Payer: Medicaid Other | Attending: Obstetrics and Gynecology | Admitting: Obstetrics and Gynecology

## 2021-12-20 ENCOUNTER — Encounter (HOSPITAL_COMMUNITY): Payer: Self-pay | Admitting: Obstetrics and Gynecology

## 2021-12-20 ENCOUNTER — Inpatient Hospital Stay (HOSPITAL_COMMUNITY): Payer: Medicaid Other | Admitting: Anesthesiology

## 2021-12-20 DIAGNOSIS — O26893 Other specified pregnancy related conditions, third trimester: Principal | ICD-10-CM | POA: Diagnosis present

## 2021-12-20 DIAGNOSIS — Z20828 Contact with and (suspected) exposure to other viral communicable diseases: Secondary | ICD-10-CM | POA: Diagnosis present

## 2021-12-20 DIAGNOSIS — Z3A39 39 weeks gestation of pregnancy: Secondary | ICD-10-CM

## 2021-12-20 DIAGNOSIS — R030 Elevated blood-pressure reading, without diagnosis of hypertension: Secondary | ICD-10-CM | POA: Diagnosis present

## 2021-12-20 DIAGNOSIS — O09899 Supervision of other high risk pregnancies, unspecified trimester: Secondary | ICD-10-CM

## 2021-12-20 DIAGNOSIS — O99891 Other specified diseases and conditions complicating pregnancy: Secondary | ICD-10-CM | POA: Diagnosis present

## 2021-12-20 DIAGNOSIS — Z3490 Encounter for supervision of normal pregnancy, unspecified, unspecified trimester: Secondary | ICD-10-CM | POA: Diagnosis present

## 2021-12-20 LAB — CBC
HCT: 41 % (ref 36.0–46.0)
Hemoglobin: 13.8 g/dL (ref 12.0–15.0)
MCH: 29.2 pg (ref 26.0–34.0)
MCHC: 33.7 g/dL (ref 30.0–36.0)
MCV: 86.7 fL (ref 80.0–100.0)
Platelets: 209 10*3/uL (ref 150–400)
RBC: 4.73 MIL/uL (ref 3.87–5.11)
RDW: 13.5 % (ref 11.5–15.5)
WBC: 7.6 10*3/uL (ref 4.0–10.5)
nRBC: 0 % (ref 0.0–0.2)

## 2021-12-20 LAB — TYPE AND SCREEN
ABO/RH(D): AB POS
Antibody Screen: NEGATIVE

## 2021-12-20 MED ORDER — LACTATED RINGERS IV SOLN: 500.0000 mL | INTRAVENOUS | Status: DC | PRN

## 2021-12-20 MED ORDER — TERBUTALINE SULFATE 1 MG/ML IJ SOLN: 0.2500 mg | Freq: Once | INTRAMUSCULAR | Status: DC | PRN

## 2021-12-20 MED ORDER — ACETAMINOPHEN 325 MG PO TABS: 650.0000 mg | ORAL_TABLET | ORAL | Status: DC | PRN

## 2021-12-20 MED ORDER — OXYTOCIN-SODIUM CHLORIDE 30-0.9 UT/500ML-% IV SOLN: 1.0000 m[IU]/min | INTRAVENOUS | Status: DC

## 2021-12-20 MED ORDER — ONDANSETRON HCL 4 MG/2ML IJ SOLN
4.0000 mg | Freq: Four times a day (QID) | INTRAMUSCULAR | Status: DC | PRN
  Administered 2021-12-21: 4 mg via INTRAVENOUS
  Filled 2021-12-20: qty 2

## 2021-12-20 MED ORDER — OXYTOCIN BOLUS FROM INFUSION
333.0000 mL | Freq: Once | INTRAVENOUS | Status: AC
  Administered 2021-12-21: 333 mL via INTRAVENOUS

## 2021-12-20 MED ORDER — PHENYLEPHRINE 80 MCG/ML (10ML) SYRINGE FOR IV PUSH (FOR BLOOD PRESSURE SUPPORT)
80.0000 ug | PREFILLED_SYRINGE | INTRAVENOUS | Status: DC | PRN
Start: 2021-12-20 — End: 2021-12-21

## 2021-12-20 MED ORDER — LIDOCAINE HCL (PF) 1 % IJ SOLN: 30.0000 mL | INTRAMUSCULAR | Status: DC | PRN

## 2021-12-20 MED ORDER — LACTATED RINGERS IV SOLN
500.0000 mL | Freq: Once | INTRAVENOUS | Status: AC
  Administered 2021-12-20: 500 mL via INTRAVENOUS

## 2021-12-20 MED ORDER — LIDOCAINE HCL (PF) 1 % IJ SOLN
INTRAMUSCULAR | Status: DC | PRN
  Administered 2021-12-20: 10 mL via EPIDURAL
  Administered 2021-12-20: 2 mL via EPIDURAL

## 2021-12-20 MED ORDER — EPHEDRINE 5 MG/ML INJ: 10.0000 mg | INTRAVENOUS | Status: DC | PRN

## 2021-12-20 MED ORDER — OXYCODONE-ACETAMINOPHEN 5-325 MG PO TABS: 2.0000 | ORAL_TABLET | ORAL | Status: DC | PRN

## 2021-12-20 MED ORDER — DIPHENHYDRAMINE HCL 50 MG/ML IJ SOLN: 12.5000 mg | INTRAMUSCULAR | Status: DC | PRN

## 2021-12-20 MED ORDER — PHENYLEPHRINE 80 MCG/ML (10ML) SYRINGE FOR IV PUSH (FOR BLOOD PRESSURE SUPPORT): 80.0000 ug | PREFILLED_SYRINGE | INTRAVENOUS | Status: DC | PRN

## 2021-12-20 MED ORDER — OXYCODONE-ACETAMINOPHEN 5-325 MG PO TABS: 1.0000 | ORAL_TABLET | ORAL | Status: DC | PRN

## 2021-12-20 MED ORDER — MISOPROSTOL 50MCG HALF TABLET
50.0000 ug | ORAL_TABLET | ORAL | Status: DC | PRN
  Administered 2021-12-20: 50 ug via BUCCAL
  Filled 2021-12-20: qty 1

## 2021-12-20 MED ORDER — LACTATED RINGERS IV SOLN: INTRAVENOUS | Status: DC

## 2021-12-20 MED ORDER — SOD CITRATE-CITRIC ACID 500-334 MG/5ML PO SOLN: 30.0000 mL | ORAL | Status: DC | PRN

## 2021-12-20 MED ORDER — OXYTOCIN-SODIUM CHLORIDE 30-0.9 UT/500ML-% IV SOLN
2.5000 [IU]/h | INTRAVENOUS | Status: DC
  Administered 2021-12-21: 2.5 [IU]/h via INTRAVENOUS
  Filled 2021-12-20: qty 500

## 2021-12-20 MED ORDER — FENTANYL-BUPIVACAINE-NACL 0.5-0.125-0.9 MG/250ML-% EP SOLN
EPIDURAL | Status: DC | PRN
  Administered 2021-12-20: 12 mL/h via EPIDURAL

## 2021-12-20 MED ORDER — FENTANYL-BUPIVACAINE-NACL 0.5-0.125-0.9 MG/250ML-% EP SOLN
12.0000 mL/h | EPIDURAL | Status: DC | PRN
  Filled 2021-12-20: qty 250

## 2021-12-20 NOTE — Progress Notes (Signed)
Patient ID: Diamond Jones, female   DOB: 06-18-1990, 31 y.o.   MRN: [id number]  Amb around room; feels ctx as q 3 mins and stronger than before  BP 134/82 FHR 120-130s, +accels, no decels Ctx- difficult to trace w her movement Cx 5/90/vtx -2; slick memb palpated  IUP@39 .3wks Active labor  Manage expectantly at this point; can start Pitocin if ctx decrease in frequency/strength Anticipate vag del  Arabella Merles CNM 12/20/2021 11:12 PM

## 2021-12-20 NOTE — Anesthesia Procedure Notes (Addendum)
Epidural Patient location during procedure: OB Start time: 12/20/2021 11:22 PM End time: 12/20/2021 11:29 PM  Staffing Anesthesiologist: Lannie Fields, DO Performed: anesthesiologist   Preanesthetic Checklist Completed: patient identified, IV checked, risks and benefits discussed, monitors and equipment checked, pre-op evaluation and timeout performed  Epidural Patient position: sitting Prep: DuraPrep and site prepped and draped Patient monitoring: continuous pulse ox, blood pressure, heart rate and cardiac monitor Approach: midline Location: L3-L4 Injection technique: LOR air  Needle:  Needle type: Tuohy  Needle gauge: 17 G Needle length: 9 cm Needle insertion depth: 6 cm Catheter type: closed end flexible Catheter size: 19 Gauge Catheter at skin depth: 11 cm Test dose: negative  Assessment Sensory level: T8 Events: blood not aspirated, injection not painful, no injection resistance, no paresthesia and negative IV test  Additional Notes Patient identified. Risks/Benefits/Options discussed with patient including but not limited to bleeding, infection, nerve damage, paralysis, failed block, incomplete pain control, headache, blood pressure changes, nausea, vomiting, reactions to medication both or allergic, itching and postpartum back pain. Confirmed with bedside nurse the patient's most recent platelet count. Confirmed with patient that they are not currently taking any anticoagulation, have any bleeding history or any family history of bleeding disorders. Patient expressed understanding and wished to proceed. All questions were answered. Sterile technique was used throughout the entire procedure. Please see nursing notes for vital signs. Test dose was given through epidural catheter and negative prior to continuing to dose epidural or start infusion. Warning signs of high block given to the patient including shortness of breath, tingling/numbness in hands, complete motor  block, or any concerning symptoms with instructions to call for help. Patient was given instructions on fall risk and not to get out of bed. All questions and concerns addressed with instructions to call with any issues or inadequate analgesia  DPE with 24G sprotte, clear CSF. No issues.Reason for block:procedure for pain

## 2021-12-20 NOTE — Progress Notes (Signed)
Patient ID: Diamond Jones, female   DOB: 03/19/1991, 31 y.o.   MRN: [id number]  In to introduce myself to pt; cervical foley just came out; she is sitting on the couch and reports some ctx stronger than others; s/p cytotec @ 1900  BPs one 147/83, others 119/85 and 115/96 FHR 130-140s, +accels, no decels Ctx irreg Cx deferred since foley came out  IUP@39 .3wks eIOL process ^BP  Plan to check cx ~2300 and will start Pit most likely Continue to watch BPs and collect labs prn Anticipate vag del  Diamond Jones [facility] 12/20/2021 9:21 PM

## 2021-12-20 NOTE — Anesthesia Preprocedure Evaluation (Signed)
Anesthesia Evaluation  Patient identified by MRN, date of birth, ID band Patient awake    Reviewed: Allergy & Precautions, Patient's Chart, lab work & pertinent test results  Airway Mallampati: II  TM Distance: >3 FB Neck ROM: Full    Dental no notable dental hx.    Pulmonary neg pulmonary ROS,    Pulmonary exam normal breath sounds clear to auscultation       Cardiovascular negative cardio ROS Normal cardiovascular exam Rhythm:Regular Rate:Normal     Neuro/Psych negative neurological ROS  negative psych ROS   GI/Hepatic negative GI ROS, Neg liver ROS,   Endo/Other  BMI 34  Renal/GU negative Renal ROS  negative genitourinary   Musculoskeletal negative musculoskeletal ROS (+)   Abdominal   Peds negative pediatric ROS (+)  Hematology negative hematology ROS (+) Hb 13.8, plt 209   Anesthesia Other Findings   Reproductive/Obstetrics (+) Pregnancy                             Anesthesia Physical Anesthesia Plan  ASA: 2  Anesthesia Plan: Epidural   Post-op Pain Management:    Induction:   PONV Risk Score and Plan: 2  Airway Management Planned: Natural Airway  Additional Equipment: None  Intra-op Plan:   Post-operative Plan:   Informed Consent: I have reviewed the patients History and Physical, chart, labs and discussed the procedure including the risks, benefits and alternatives for the proposed anesthesia with the patient or authorized representative who has indicated his/her understanding and acceptance.       Plan Discussed with:   Anesthesia Plan Comments:         Anesthesia Quick Evaluation

## 2021-12-20 NOTE — H&P (Signed)
Diamond Jones is a 31 y.o. female G3P1011  at [redacted]w[redacted]d presenting for elective IOL.  She has no significant medical history.  Pregnancy has been uncomplicated, with short interval since last pregnancy, delivery on 09/14/21.   [place] MFM 08/27/21 at 23 weeks with FHR 157, posterior placenta, breech presentation, normal AFI, EFW 83%tile.   Nursing Staff Provider  Office Location  [place] Dating  LMP  [facility] Model [x]  Traditional [ ]  Centering [ ]  Mom-Baby Dyad    Language   English  Anatomy  Normal, but limited  Flu Vaccine   04/2021 Genetic/Carrier Screen  NIPS: declines   AFP:  declines Horizon:  TDaP Vaccine    Hgb A1C or  GTT Early  Third trimester   COVID Vaccine  x2 Pfizer    LAB RESULTS   Rhogam   Blood Type AB/Positive/-- (12/16 0955)   Baby Feeding Plan  Bottle  Antibody Negative (12/16 0955)  Contraception  ? IUD Rubella 6.90 (12/16 0955)  Circumcision  Yes if boy  RPR Non Reactive (12/16 0955)   Pediatrician   [facility]  HBsAg Negative (12/16 05-24-1996)   Support Person  FOB HCVAb   Prenatal Classes  Declined  HIV Non Reactive (12/16 0955)     BTL Consent  GBS   (For PCN allergy, check sensitivities)   VBAC Consent  Pap        DME Rx [ ]  BP cuff [ ]  Weight Scale Waterbirth  [ ]  Class [ ]  Consent [ ]  CNM visit  PHQ9 & GAD7 [ X ] new OB [  ] 28 weeks  [  ] 36 weeks Induction  [ ]  Orders Entered [ ] Foley Y/N   OB History     Gravida  3   Para  1   Term  1   Preterm  0   AB  1   Living  1      SAB  1   IAB  0   Ectopic  0   Multiple  0   Live Births  1          Past Medical History:  Diagnosis Date   Medical history non-contributory    Past Surgical History:  Procedure Laterality Date   NO PAST SURGERIES     Family History: family history includes Diabetes in her father, maternal grandfather, and maternal grandmother. Social History:  reports that she has never smoked. She has never used smokeless tobacco. She reports that she does not  currently use alcohol. She reports that she does not use drugs.     Maternal Diabetes: No Genetic Screening: Declined Maternal Ultrasounds/Referrals: Normal Fetal Ultrasounds or other Referrals:  None Maternal Substance Abuse:  No Significant Maternal Medications:  None Significant Maternal Lab Results:  Group B Strep negative Other Comments:  None  Review of Systems  Constitutional:  Negative for chills, fatigue and fever.  Eyes:  Negative for visual disturbance.  Respiratory:  Negative for shortness of breath.   Cardiovascular:  Negative for chest pain.  Gastrointestinal:  Negative for abdominal pain and vomiting.  Genitourinary:  Negative for difficulty urinating, dysuria, flank pain, pelvic pain, vaginal bleeding, vaginal discharge and vaginal pain.  Neurological:  Negative for dizziness and headaches.  Psychiatric/Behavioral: Negative.     Maternal Medical History:  Reason for admission: Elective IOL  Contractions: Frequency: irregular.   Perceived severity is mild.   Fetal activity: Perceived fetal activity is normal.   Prenatal complications: no  prenatal complications Prenatal Complications - Diabetes: none.   Dilation: 2 Effacement (%): 50, 60 Station: -3 Exam by:: L Leftwich-Kirby CNM Blood pressure 119/85, pulse 91, temperature 98.1 F (36.7 C), resp. rate 19, height 5\' 3"  (1.6 m), weight 87.4 kg, last menstrual period 03/19/2021, unknown if currently breastfeeding. Maternal Exam:  Uterine Assessment: Contraction strength is mild.  Contraction frequency is irregular.  Abdomen: Fetal presentation: vertex Cervix: Cervix evaluated by digital exam.     Fetal Exam Fetal Monitor Review: Mode: ultrasound.   Baseline rate: 140.  Variability: moderate (6-25 bpm).   Pattern: accelerations present and no decelerations.   Fetal State Assessment: Category I - tracings are normal.   Physical Exam Vitals and nursing note reviewed.  Constitutional:      Appearance:  She is well-developed.  Cardiovascular:     Rate and Rhythm: Normal rate.     Heart sounds: Normal heart sounds.  Pulmonary:     Effort: Pulmonary effort is normal.     Breath sounds: Normal breath sounds.  Abdominal:     Palpations: Abdomen is soft.  Musculoskeletal:        General: Normal range of motion.     Cervical back: Normal range of motion.  Skin:    General: Skin is warm and dry.  Neurological:     Mental Status: She is alert and oriented to person, place, and time.  Psychiatric:        Behavior: Behavior normal.        Thought Content: Thought content normal.        Judgment: Judgment normal.     Prenatal labs: ABO, Rh: --/--/AB POS (07/09 1758) Antibody: NEG (07/09 1758) Rubella: 6.90 (12/16 0955) RPR: Non Reactive (04/20 0934)  HBsAg: Negative (12/16 0955)  HIV: Non Reactive (04/20 0934)  GBS: Negative/-- (06/14 1050)   Assessment/Plan: 08-10-1967  at [redacted]w[redacted]d  admitted for elective IOL GBS neg  Admit to L&D Cervix 2/50/-2 Discussed options, including cervical ripening and starting IV Pitocin Plan to insert foley balloon and give dose of buccal cytotec  Will start Pitocin,consider AROM when foley balloon is out and pt is 3-4 cm.   Anticipate NSVD   [redacted]w[redacted]d 12/20/2021, 7:04 PM

## 2021-12-21 ENCOUNTER — Encounter (HOSPITAL_COMMUNITY): Payer: Self-pay | Admitting: Obstetrics and Gynecology

## 2021-12-21 DIAGNOSIS — Z3A39 39 weeks gestation of pregnancy: Secondary | ICD-10-CM

## 2021-12-21 LAB — RPR: RPR Ser Ql: NONREACTIVE

## 2021-12-21 MED ORDER — SENNOSIDES-DOCUSATE SODIUM 8.6-50 MG PO TABS
2.0000 | ORAL_TABLET | ORAL | Status: DC
  Administered 2021-12-22: 2 via ORAL
  Filled 2021-12-21: qty 2

## 2021-12-21 MED ORDER — WITCH HAZEL-GLYCERIN EX PADS: 1.0000 | MEDICATED_PAD | CUTANEOUS | Status: DC | PRN

## 2021-12-21 MED ORDER — ONDANSETRON HCL 4 MG PO TABS: 4.0000 mg | ORAL_TABLET | ORAL | Status: DC | PRN

## 2021-12-21 MED ORDER — ONDANSETRON HCL 4 MG/2ML IJ SOLN: 4.0000 mg | INTRAMUSCULAR | Status: DC | PRN

## 2021-12-21 MED ORDER — DIBUCAINE (PERIANAL) 1 % EX OINT: 1.0000 | TOPICAL_OINTMENT | CUTANEOUS | Status: DC | PRN

## 2021-12-21 MED ORDER — ZOLPIDEM TARTRATE 5 MG PO TABS: 5.0000 mg | ORAL_TABLET | Freq: Every evening | ORAL | Status: DC | PRN

## 2021-12-21 MED ORDER — TETANUS-DIPHTH-ACELL PERTUSSIS 5-2.5-18.5 LF-MCG/0.5 IM SUSY: 0.5000 mL | PREFILLED_SYRINGE | Freq: Once | INTRAMUSCULAR | Status: DC

## 2021-12-21 MED ORDER — MEASLES, MUMPS & RUBELLA VAC IJ SOLR: 0.5000 mL | Freq: Once | INTRAMUSCULAR | Status: DC

## 2021-12-21 MED ORDER — COCONUT OIL OIL: 1.0000 | TOPICAL_OIL | Status: DC | PRN

## 2021-12-21 MED ORDER — DIPHENHYDRAMINE HCL 25 MG PO CAPS: 25.0000 mg | ORAL_CAPSULE | Freq: Four times a day (QID) | ORAL | Status: DC | PRN

## 2021-12-21 MED ORDER — SODIUM CHLORIDE 0.9% FLUSH
3.0000 mL | Freq: Two times a day (BID) | INTRAVENOUS | Status: DC
  Administered 2021-12-21: 3 mL via INTRAVENOUS

## 2021-12-21 MED ORDER — PRENATAL MULTIVITAMIN CH
1.0000 | ORAL_TABLET | Freq: Every day | ORAL | Status: DC
  Administered 2021-12-21 – 2021-12-22 (×2): 1 via ORAL
  Filled 2021-12-21 (×2): qty 1

## 2021-12-21 MED ORDER — SODIUM CHLORIDE 0.9 % IV SOLN: INTRAVENOUS | Status: DC | PRN

## 2021-12-21 MED ORDER — BENZOCAINE-MENTHOL 20-0.5 % EX AERO
1.0000 | INHALATION_SPRAY | CUTANEOUS | Status: DC | PRN
  Filled 2021-12-21: qty 56

## 2021-12-21 MED ORDER — SIMETHICONE 80 MG PO CHEW: 80.0000 mg | CHEWABLE_TABLET | ORAL | Status: DC | PRN

## 2021-12-21 MED ORDER — ACETAMINOPHEN 325 MG PO TABS: 650.0000 mg | ORAL_TABLET | ORAL | Status: DC | PRN

## 2021-12-21 MED ORDER — IBUPROFEN 600 MG PO TABS
600.0000 mg | ORAL_TABLET | Freq: Four times a day (QID) | ORAL | Status: DC
  Administered 2021-12-21 – 2021-12-22 (×4): 600 mg via ORAL
  Filled 2021-12-21 (×5): qty 1

## 2021-12-21 MED ORDER — SODIUM CHLORIDE 0.9% FLUSH
3.0000 mL | INTRAVENOUS | Status: DC | PRN
Start: 2021-12-21 — End: 2021-12-22

## 2021-12-21 NOTE — Lactation Note (Signed)
This note was copied from a baby's chart. Lactation Consultation Note  Patient Name: Diamond Jones YELYH'T Date: 12/21/2021 Reason for consult: L&D Initial assessment Age:29 hours  P2, Baby latched upon entering. Mother states she had challenges with breastfeeding and supply with first child.  She used nipple shield and pumped. Lactation will discuss further later and follow up on MBU.  Maternal Data Does the patient have breastfeeding experience prior to this delivery?: Yes  Feeding Mother's Current Feeding Choice: Breast Milk  LATCH Score Latch: Grasps breast easily, tongue down, lips flanged, rhythmical sucking.  Audible Swallowing: Spontaneous and intermittent  Type of Nipple: Everted at rest and after stimulation  Comfort (Breast/Nipple): Soft / non-tender  Hold (Positioning): No assistance needed to correctly position infant at breast.  LATCH Score: 10   Interventions Interventions: Education  Consult Status Consult Status: Follow-up from L&D    [name] [facility] 12/21/2021, 10:20 AM

## 2021-12-21 NOTE — Lactation Note (Signed)
This note was copied from a baby's chart. Lactation Consultation Note  Patient Name: Diamond Jones [id number] Date: 12/21/2021 Reason for consult: Initial assessment;Term Age:30 hours   P2 mother whose infant is now 39 hours old.  This is a term baby at 39+4 weeks.  Mother's current feeding preference is breast.  Baby "[name]" was swaddled and asleep in father's arms when I arrived.  He has been latching and feeding well per mother: L&D RN charted a LATCH score of a 10.  Mother excited.  She reported that she had a low milk supply with her first child (now 74 months old).  Breast feeding basics reviewed.  Mother has already done hand expression and is able to observe colostrum drops.  Encouraged feeding all expressed drops to "[name]."  Suggested she call her RN/LC for latch assistance as needed.   Maternal Data Has patient been taught Hand Expression?: Yes Does the patient have breastfeeding experience prior to this delivery?: Yes How long did the patient breastfeed?: 6 weeks  Feeding Mother's Current Feeding Choice: Breast Milk  LATCH Score Latch: Grasps breast easily, tongue down, lips flanged, rhythmical sucking.  Audible Swallowing: Spontaneous and intermittent  Type of Nipple: Everted at rest and after stimulation  Comfort (Breast/Nipple): Soft / non-tender  Hold (Positioning): No assistance needed to correctly position infant at breast.  LATCH Score: 10   Lactation Tools Discussed/Used    Interventions Interventions: Breast feeding basics reviewed;Education;LC Services brochure  Discharge Pump: Personal  Consult Status Consult Status: Follow-up Date: 12/22/21 Follow-up type: In-patient    Dora Sims 12/21/2021, 12:52 PM

## 2021-12-21 NOTE — Discharge Summary (Signed)
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 yo G3P1011 at 370w4das admitted to the hospital 12/20/2021 for induction of labor.  Indication for induction: Elective.  Patient had an uncomplicated labor course as follows: Membrane Rupture Time/Date: 6:40 AM ,12/21/2021   Delivery Method:Vaginal, Spontaneous  Episiotomy: None  Lacerations:  Periurethral  Details of delivery can be found in separate delivery note.  Patient had a routine postpartum course. Patient is discharged home 12/21/21.  Newborn Data: Birth date:12/21/2021  Birth time:9:21 AM  Gender:Female  Living status:Living  Apgars:8 ,9  Weight:   Magnesium Sulfate received: No BMZ received: No Rhophylac:No MMR:No T-DaP:Given prenatally Flu: No Transfusion:{Transfusion received:30440034}  Physical exam  Vitals:   12/21/21 0732 12/21/21 0801 12/21/21 0831 12/21/21 0901  BP: 118/72 107/72 125/86 124/85  Pulse: 89 95 (!) 102 94  Resp: 16 20 19 16   Temp:  98.1 F (36.7 C)    TempSrc:      SpO2:      Weight:      Height:       General: {Exam; general:21111117} Lochia: {Desc;  appropriate/inappropriate:30686::"appropriate"} Uterine Fundus: {Desc; firm/soft:30687} Incision: {Exam; incision:21111123} DVT Evaluation: {Exam; dvt:2111122} Labs: Lab Results  Component Value Date   WBC 7.6 12/20/2021   HGB 13.8 12/20/2021   HCT 41.0 12/20/2021   MCV 86.7 12/20/2021   PLT 209 12/20/2021       No data to display         Edinburgh Score:    10/24/2020   10:01 AM  Edinburgh Postnatal Depression Scale Screening Tool  I have been able to laugh and see the funny side of things. 0  I have looked forward with enjoyment to things. 0  I have blamed myself unnecessarily when things went wrong. 0  I have been anxious or worried for no good reason. 0  I have felt scared or panicky for no good reason. 0  Things have been getting on top of me. 0  I have been so unhappy that I have had difficulty sleeping. 0  I have felt sad or miserable. 0  I have been so unhappy that I have been crying. 0  The thought of harming myself has occurred to me. 0  Edinburgh Postnatal Depression Scale Total 0     After visit meds:  Allergies as of 12/21/2021   No Known Allergies   Med Rec must be completed prior to using this [facility]*        Discharge home in stable condition Infant Feeding: {Baby feeding:23562} Infant Disposition:{CHL IP OB HOME WITH MOBWLSLH:73428}ischarge  instruction: per After Visit Summary and Postpartum booklet. Activity: Advance as tolerated. Pelvic rest for 6 weeks.  Diet: {OB BNRW:48301599} Future Appointments:No future appointments. Follow up Visit:  Message sent to HP by Dr Higinio Plan:  Please schedule this patient for a In person postpartum visit in 6 weeks with the following provider: Any provider. Additional Postpartum F/U: None   Low risk pregnancy complicated by:  CMV exposure, short interval  Delivery mode:  Vaginal, Spontaneous  Anticipated Birth Control:  Unsure   12/21/2021 Patriciaann Clan, DO

## 2021-12-21 NOTE — Progress Notes (Signed)
Diamond Jones is a 31 y.o. G3P1011 at [redacted]w[redacted]d by LMP admitted for induction of labor due to Elective at term.  Subjective:   Objective: BP 120/79   Pulse 91   Temp 98.3 F (36.8 C) (Oral)   Resp 16   Ht 5\' 3"  (1.6 m)   Wt 87.4 kg   LMP 03/19/2021 (Approximate)   SpO2 98%   BMI 34.14 kg/m  No intake/output data recorded. No intake/output data recorded.  FHT:  FHR: 135-140 bpm, variability: moderate,  accelerations:  Present,  decelerations:  Absent UC:   regular, every 2-3 minutes SVE:   Dilation: 7 Effacement (%): 90 Station: -2 Exam by:: [email or web address], CNM  Labs: Lab Results  Component Value Date   WBC 7.6 12/20/2021   HGB 13.8 12/20/2021   HCT 41.0 12/20/2021   MCV 86.7 12/20/2021   PLT 209 12/20/2021    Assessment / Plan: Induction of labor due to term with favorable cervix  Labor: Progressing normally Preeclampsia:  no signs or symptoms of toxicity Fetal Wellbeing:  Category I Pain Control:  Epidural I/D:   GBS negative Anticipated MOD:  NSVD  02/20/2022, Medical Student 12/21/2021, 4:00 AM  Patient ID: 02/21/2022, female   DOB: 1990-11-13, 31 y.o.   MRN: [id number]

## 2021-12-21 NOTE — Progress Notes (Signed)
Patient ID: Diamond Jones, female   DOB: 12-30-1990, 31 y.o.   MRN: [id number]  Sitting up in throne position; not feeling pressure; leaking fluid had saturated a towel a little bit ago  BPs 118/72, 116/79 FHR 135-140s, +accels, occ mi variables Ctx q 3-4 mins without Pit Cx deferred  IUP@39 .4wks Active labor  Plan to check cx within the next hour if she doesn't feel the urge to push beforehand Anticipate vag del  Diamond Jones [facility] 12/21/2021 7:39 AM

## 2021-12-21 NOTE — Plan of Care (Signed)
Pt demonstrated understanding 

## 2021-12-22 ENCOUNTER — Other Ambulatory Visit (HOSPITAL_BASED_OUTPATIENT_CLINIC_OR_DEPARTMENT_OTHER): Payer: Self-pay

## 2021-12-22 LAB — CBC
HCT: 38.7 % (ref 36.0–46.0)
Hemoglobin: 12.8 g/dL (ref 12.0–15.0)
MCH: 29.2 pg (ref 26.0–34.0)
MCHC: 33.1 g/dL (ref 30.0–36.0)
MCV: 88.2 fL (ref 80.0–100.0)
Platelets: 147 10*3/uL — ABNORMAL LOW (ref 150–400)
RBC: 4.39 MIL/uL (ref 3.87–5.11)
RDW: 14 % (ref 11.5–15.5)
WBC: 11.9 10*3/uL — ABNORMAL HIGH (ref 4.0–10.5)
nRBC: 0 % (ref 0.0–0.2)

## 2021-12-22 MED ORDER — IBUPROFEN 600 MG PO TABS
600.0000 mg | ORAL_TABLET | Freq: Four times a day (QID) | ORAL | 0 refills | Status: DC
  Filled 2021-12-22: qty 30, 8d supply, fill #0

## 2021-12-22 NOTE — Anesthesia Postprocedure Evaluation (Signed)
Anesthesia Post Note  Patient: Diamond Jones  Procedure(s) Performed: AN AD HOC LABOR EPIDURAL     Patient location during evaluation: Mother Baby Anesthesia Type: Epidural Level of consciousness: awake and alert Pain management: pain level controlled Vital Signs Assessment: post-procedure vital signs reviewed and stable Respiratory status: spontaneous breathing, nonlabored ventilation and respiratory function stable Cardiovascular status: stable Postop Assessment: no headache, no backache and epidural receding Anesthetic complications: no   No notable events documented.  Last Vitals:  Vitals:   12/22/21 0020 12/22/21 0510  BP: 106/76 111/69  Pulse: 76 73  Resp: 16 18  Temp: 36.8 C 36.7 C  SpO2: 100% 100%    Last Pain:  Vitals:   12/22/21 0510  TempSrc: Oral  PainSc: 2    Pain Goal:                   [name]

## 2021-12-22 NOTE — Lactation Note (Signed)
This note was copied from a baby's chart. Lactation Consultation Note  Patient Name: Diamond Jones RWCHJ'S Date: 12/22/2021 Reason for consult: Follow-up assessment;Term;Infant weight loss;Breastfeeding assistance (2.94% WL) Age:29 hours  P2, Term, Infant Female, 2.94% WL  LC entered the room and baby was in the bassinet asleep. Per mom, breastfeeding is going well. Mom states that she does not have any questions or concerns.   LC reviewed engorgement, warning signs, infant I/O, and outpatient services.   Mom will continue to feed baby according to feeding cues and watch infant output. If the output decreases or the stool is not changing color, mom will contact the pediatrician.   Current Feeding Plan: Breastfeed according to feeding cues 8+ times in 24 hours.  Watch infant output and call pediatrician with questions or concerns. Call outpatient LC with breastfeeding concerns.   Maternal Data    Feeding    LATCH Score                    Lactation Tools Discussed/Used    Interventions Interventions: Breast feeding basics reviewed;Education  Discharge Discharge Education: Engorgement and breast care;Warning signs for feeding baby;Outpatient recommendation  Consult Status Consult Status: Complete Date: 12/22/21 Follow-up type: In-patient    Orvil Feil [employer] 12/22/2021, 2:25 PM

## 2021-12-22 NOTE — Social Work (Signed)
CSW received consult for hx of PPD. CSW met with MOB to offer support and complete assessment.    CSW met with MOB at bedside and introduced CSW role. CSW observed MOB breastfeeding and responding to infant's cues appropriately. FOB present at bedside. MOB welcomed CSW to complete the assessment with FOB present. CSW inquired how MOB has felt since giving birth. MOB reported that she has been feeling "good and relieved."  MOB shared that during the pregnancy her emotions were "everywhere."  She expressed that she had "a lot of transitions" such as buying a new house and moving. CSW vaildated MOB emotions. CSW inquired about MOB history of postpartum depression. 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 She felt anxious. MOB reported to manage her symptoms she saw therapist for eight months which she felt was very helpful. MOB reported that she plans to see a therapist again if she experiences PP anxiety again. MOB identified her spouse as her primary support. CSW assessed MOB for safety. MOB denied thoughts of harm to self and others.   CSW provided education regarding the baby blues period vs. perinatal mood disorders, discussed treatment and gave resources for mental health follow up if concerns arise.  CSW recommended MOB complete a self-evaluation during the postpartum time period using the New Mom Checklist from Postpartum Progress and encouraged MOB to contact a medical professional if symptoms are noted at any time. MOB reported she feels comfortable reaching out to her provider if she has concerns.   MOB reported she has essential items for the infant. MOB has chosen [employer] for infant's follow up care. CSW provided review of Sudden Infant Death Syndrome (SIDS) precautions. CSW assessed MOB for additional needs. MOB reported no further need.   CSW identifies no further need for intervention and no barriers to discharge at this time.   Kathrin Greathouse, MSW, LCSW [facility] [employer] Worker  [phone] 12/22/2021  1:26 PM

## 2021-12-24 ENCOUNTER — Encounter: Payer: Medicaid Other | Admitting: Family Medicine

## 2021-12-29 ENCOUNTER — Other Ambulatory Visit (HOSPITAL_BASED_OUTPATIENT_CLINIC_OR_DEPARTMENT_OTHER): Payer: Self-pay

## 2021-12-29 ENCOUNTER — Telehealth (HOSPITAL_COMMUNITY): Payer: Self-pay | Admitting: *Deleted

## 2021-12-29 NOTE — Telephone Encounter (Signed)
Mom reports feeling good. No concerns about herself at this time. EPDS=0 [facility] score=2) Mom reports baby is doing well. Feeding, peeing, and pooping without difficulty. Safe sleep reviewed. Mom reports no concerns about baby at present.  Duffy Rhody, RN 12-29-2021 at 3:14pm

## 2021-12-30 ENCOUNTER — Other Ambulatory Visit (HOSPITAL_BASED_OUTPATIENT_CLINIC_OR_DEPARTMENT_OTHER): Payer: Self-pay

## 2022-02-03 ENCOUNTER — Ambulatory Visit (INDEPENDENT_AMBULATORY_CARE_PROVIDER_SITE_OTHER): Payer: Medicaid Other | Admitting: Family Medicine

## 2022-02-03 ENCOUNTER — Other Ambulatory Visit (HOSPITAL_BASED_OUTPATIENT_CLINIC_OR_DEPARTMENT_OTHER): Payer: Self-pay

## 2022-02-03 MED ORDER — NORGESTIMATE-ETH ESTRADIOL 0.25-35 MG-MCG PO TABS
1.0000 | ORAL_TABLET | Freq: Every day | ORAL | 3 refills | Status: DC
  Filled 2022-02-03: qty 84, 84d supply, fill #0

## 2022-02-03 NOTE — Progress Notes (Signed)
Post Partum Visit Note  Diamond Jones is a 31 y.o. G78P2012 female who presents for a postpartum visit. She is 6 weeks postpartum following a normal spontaneous vaginal delivery.  I have fully reviewed the prenatal and intrapartum course. The delivery was at 39.4 gestational weeks.  Anesthesia: epidural. Postpartum course has been uneventful. Baby is doing well. Baby is feeding by bottle - Gerber gentle soothe . Bleeding no bleeding. Bowel function is normal. Bladder function is normal. Patient is sexually active. Contraception method is OCP (estrogen/progesterone). Postpartum depression screening: negative. Score:2   The pregnancy intention screening data noted above was reviewed. Potential methods of contraception were discussed. The patient elected to proceed with No data recorded.   Edinburgh Postnatal Depression Scale - 02/03/22 1428       Edinburgh Postnatal Depression Scale:  In the Past 7 Days   I have been able to laugh and see the funny side of things. 0    I have looked forward with enjoyment to things. 0    I have blamed myself unnecessarily when things went wrong. 0    I have been anxious or worried for no good reason. 0    I have felt scared or panicky for no good reason. 0    Things have been getting on top of me. 0    I have been so unhappy that I have had difficulty sleeping. 0    I have felt sad or miserable. 0    I have been so unhappy that I have been crying. 0    The thought of harming myself has occurred to me. 0    Edinburgh Postnatal Depression Scale Total 0             Health Maintenance Due  Topic Date Due   COVID-19 Vaccine (2 - Pfizer series) 04/09/2020   INFLUENZA VACCINE  01/12/2022    The following portions of the patient's history were reviewed and updated as appropriate: allergies, current medications, past family history, past medical history, past social history, past surgical history, and problem list.  Review of Systems Pertinent items  are noted in HPI.  Objective:  BP 110/73   Pulse 70   Wt 163 lb (73.9 kg)   LMP 03/19/2021 (Approximate)   BMI 28.87 kg/m    General:  alert, cooperative, and no distress   Breasts:  not indicated  Lungs: clear to auscultation bilaterally  Heart:  regular rate and rhythm, S1, S2 normal, no murmur, click, rub or gallop  Abdomen: soft, non-tender; bowel sounds normal; no masses,  no organomegaly   Wound N/a  GU exam:  not indicated       Assessment:   1. Postpartum care following vaginal delivery   Plan:   Essential components of care per ACOG recommendations:  1.  Mood and well being: Patient with negative depression screening today. Reviewed local resources for support.  - Patient tobacco use? No.   - hx of drug use? No.    2. Infant care and feeding:  -Patient currently breastmilk feeding? No.  -Social determinants of health (SDOH) reviewed in EPIC. No concerns  3. Sexuality, contraception and birth spacing - Patient does not want a pregnancy in the next year.   - Reviewed reproductive life planning. Reviewed contraceptive methods based on pt preferences and effectiveness.  Patient desired Oral Contraceptive today.   - Discussed birth spacing of 18 months  4. Sleep and fatigue -Encouraged family/partner/community support of 4 hrs of uninterrupted  sleep to help with mood and fatigue  5. Physical Recovery  - Discussed patients delivery and complications. She describes her labor as good. - Patient had a Vaginal, no problems at delivery.  Perineal healing reviewed. Patient expressed understanding - Patient has urinary incontinence? No. - Patient is safe to resume physical and sexual activity  6.  Health Maintenance - HM due items addressed Yes - Last pap smear  Diagnosis  Date Value Ref Range Status  05/29/2021   Final   - Negative for intraepithelial lesion or malignancy (NILM)   Pap smear not done at today's visit.  -Breast Cancer screening indicated? No.    7. Chronic Disease/Pregnancy Condition follow up: None  - PCP follow up  Levie Heritage, DO Center for [facility], [facility]

## 2022-02-04 ENCOUNTER — Encounter: Payer: Self-pay | Admitting: Family Medicine

## 2022-05-14 ENCOUNTER — Ambulatory Visit: Payer: Self-pay | Admitting: Family

## 2022-05-27 ENCOUNTER — Encounter: Payer: Self-pay | Admitting: *Deleted

## 2022-07-16 ENCOUNTER — Ambulatory Visit: Payer: Medicaid Other | Admitting: Family Medicine

## 2022-08-18 ENCOUNTER — Ambulatory Visit: Payer: Medicaid Other | Admitting: Family Medicine

## 2022-09-03 ENCOUNTER — Ambulatory Visit: Payer: Medicaid Other | Admitting: Family Medicine

## 2022-10-15 ENCOUNTER — Ambulatory Visit (INDEPENDENT_AMBULATORY_CARE_PROVIDER_SITE_OTHER): Payer: Medicaid Other | Admitting: Nurse Practitioner

## 2022-10-15 ENCOUNTER — Encounter: Payer: Self-pay | Admitting: Nurse Practitioner

## 2022-10-15 VITALS — BP 110/88 | HR 68 | Temp 97.0°F | Ht 63.0 in | Wt 146.0 lb

## 2022-10-15 DIAGNOSIS — R5383 Other fatigue: Secondary | ICD-10-CM | POA: Diagnosis not present

## 2022-10-15 DIAGNOSIS — R350 Frequency of micturition: Secondary | ICD-10-CM | POA: Insufficient documentation

## 2022-10-15 LAB — COMPREHENSIVE METABOLIC PANEL
ALT: 18 U/L (ref 0–35)
AST: 18 U/L (ref 0–37)
Albumin: 4.5 g/dL (ref 3.5–5.2)
Alkaline Phosphatase: 49 U/L (ref 39–117)
BUN: 16 mg/dL (ref 6–23)
CO2: 26 mEq/L (ref 19–32)
Calcium: 9.9 mg/dL (ref 8.4–10.5)
Chloride: 104 mEq/L (ref 96–112)
Creatinine, Ser: 0.75 mg/dL (ref 0.40–1.20)
GFR: 107.54 mL/min (ref >60.00)
Glucose, Bld: 85 mg/dL (ref 70–99)
Potassium: 4.3 mEq/L (ref 3.5–5.1)
Sodium: 139 mEq/L (ref 135–145)
Total Bilirubin: 0.6 mg/dL (ref 0.2–1.2)
Total Protein: 7.5 g/dL (ref 6.0–8.3)

## 2022-10-15 LAB — CBC
HCT: 43.6 % (ref 36.0–46.0)
Hemoglobin: 15.1 g/dL — ABNORMAL HIGH (ref 12.0–15.0)
MCHC: 34.7 g/dL (ref 30.0–36.0)
MCV: 84.1 fl (ref 78.0–100.0)
Platelets: 293 10*3/uL (ref 150.0–400.0)
RBC: 5.19 Mil/uL — ABNORMAL HIGH (ref 3.87–5.11)
RDW: 12.8 % (ref 11.5–15.5)
WBC: 5.8 10*3/uL (ref 4.0–10.5)

## 2022-10-15 LAB — POCT URINALYSIS DIPSTICK
Bilirubin, UA: NEGATIVE
Blood, UA: NEGATIVE
Glucose, UA: NEGATIVE
Ketones, UA: NEGATIVE
Nitrite, UA: NEGATIVE
Protein, UA: NEGATIVE
Spec Grav, UA: 1.015 (ref 1.010–1.025)
Urobilinogen, UA: 0.2 E.U./dL
pH, UA: 6 (ref 5.0–8.0)

## 2022-10-15 LAB — VITAMIN D 25 HYDROXY (VIT D DEFICIENCY, FRACTURES): VITD: 18.06 ng/mL — ABNORMAL LOW (ref 30.00–100.00)

## 2022-10-15 LAB — TSH: TSH: 1.65 u[IU]/mL (ref 0.35–5.50)

## 2022-10-15 LAB — HEMOGLOBIN A1C: Hgb A1c MFr Bld: 5.2 % (ref 4.6–6.5)

## 2022-10-15 NOTE — Patient Instructions (Signed)
It was great to see you!  We are checking your labs today and will let you know the results via mychart/phone.   Start kegel exercises daily.   Let's follow-up in 3 months, sooner if you have concerns.  If a referral was placed today, you will be contacted for an appointment. Please note that routine referrals can sometimes take up to 3-4 weeks to process. Please call our office if you haven't heard anything after this time frame.  Take care,  Rodman Pickle, NP

## 2022-10-15 NOTE — Progress Notes (Signed)
New Patient Visit  BP 110/88 (BP Location: Left Arm)   Pulse 68   Temp (!) 97 F (36.1 C)   Ht 5\' 3"  (1.6 m)   Wt 146 lb (66.2 kg)   LMP 10/08/2022 (Exact Date)   SpO2 98%   Breastfeeding No   BMI 25.86 kg/m    Subjective:    Patient ID: Diamond Jones, female    DOB: [birth year], 32 y.o.   MRN: [id number]  CC: Chief Complaint  Patient presents with   Establish Care    NP. Est. Care, concerns with urine frequency, fatigue, overall health     HPI: Diamond Jones is a 32 y.o. female presents for new patient visit to establish care.  Introduced to Publishing rights manager role and practice setting.  All questions answered.  Discussed provider/patient relationship and expectations.  She has been experiencing urinary frequency for the past 6 months. She wakes up 4-6 times per night to go the bathroom. She tried to cut off her fluids at 5 pm and still woke up 4 times to to the bathroom. Sometimes, the volumes of her urine are normal, and other times they are small amounts.    Depression and Anxiety Screen Done:     10/15/2022    1:34 PM 11/25/2021    9:53 AM 10/01/2021    9:24 AM 05/29/2021    9:50 AM  Depression screen PHQ 2/9  Decreased Interest 1 0 1 0  Down, Depressed, Hopeless 0 0 2   PHQ - 2 Score 1 0 3 0  Altered sleeping 2 0 0 2  Tired, decreased energy 3 3 2 2   Change in appetite 0 1 2 1   Feeling bad or failure about yourself  1 0 3 0  Trouble concentrating 0 0 0 0  Moving slowly or fidgety/restless 0 0 1 0  Suicidal thoughts 0 0 0 0  PHQ-9 Score 7 4 11 5   Difficult doing work/chores Not difficult at all         10/15/2022    1:35 PM 11/25/2021    9:54 AM 10/01/2021    9:25 AM 05/29/2021    9:51 AM  GAD 7 : Generalized Anxiety Score  Nervous, Anxious, on Edge 1 0 2 1  Control/stop worrying 2 0 3 0  Worry too much - different things 2 0 3 1  Trouble relaxing 1 0 2 0  Restless 0 0 0 0  Easily annoyed or irritable 2 0 3 0  Afraid - awful might happen 0 0 0 0  Total  GAD 7 Score 8 0 13 2    Past Medical History:  Diagnosis Date   Medical history non-contributory     Past Surgical History:  Procedure Laterality Date   NO PAST SURGERIES      Family History  Problem Relation Age of Onset   Hypertension Mother    Diabetes Father    Hypertension Maternal Grandmother    Diabetes Maternal Grandmother    Diabetes Maternal Grandfather      Social History   Tobacco Use   Smoking status: Never   Smokeless tobacco: Never  Vaping Use   Vaping Use: Never used  Substance Use Topics   Alcohol use: Not Currently    Comment: rare   Drug use: Never    No current outpatient medications on file prior to visit.   No current facility-administered medications on file prior to visit.     Review of Systems  Constitutional:  Positive for fatigue. Negative for fever.  HENT: Negative.    Eyes: Negative.   Respiratory: Negative.    Cardiovascular: Negative.   Gastrointestinal: Negative.   Endocrine: Negative.   Genitourinary:  Positive for frequency and urgency. Negative for dysuria.  Musculoskeletal: Negative.   Skin: Negative.   Neurological: Negative.   Psychiatric/Behavioral:  The patient is nervous/anxious.       Objective:    BP 110/88 (BP Location: Left Arm)   Pulse 68   Temp (!) 97 F (36.1 C)   Ht 5\' 3"  (1.6 m)   Wt 146 lb (66.2 kg)   LMP 10/08/2022 (Exact Date)   SpO2 98%   Breastfeeding No   BMI 25.86 kg/m   Wt Readings from Last 3 Encounters:  10/15/22 146 lb (66.2 kg)  02/03/22 163 lb (73.9 kg)  12/20/21 192 lb 11.2 oz (87.4 kg)    BP Readings from Last 3 Encounters:  10/15/22 110/88  02/03/22 110/73  12/22/21 111/69    Physical Exam Vitals and nursing note reviewed.  Constitutional:      General: She is not in acute distress.    Appearance: Normal appearance.  HENT:     Head: Normocephalic and atraumatic.     Right Ear: Tympanic membrane, ear canal and external ear normal.     Left Ear: Tympanic membrane, ear  canal and external ear normal.  Eyes:     Conjunctiva/sclera: Conjunctivae normal.  Cardiovascular:     Rate and Rhythm: Normal rate and regular rhythm.     Pulses: Normal pulses.     Heart sounds: Normal heart sounds.  Pulmonary:     Effort: Pulmonary effort is normal.     Breath sounds: Normal breath sounds.  Abdominal:     Palpations: Abdomen is soft.     Tenderness: There is no abdominal tenderness.  Musculoskeletal:        General: Normal range of motion.     Cervical back: Normal range of motion and neck supple.     Right lower leg: No edema.     Left lower leg: No edema.  Lymphadenopathy:     Cervical: No cervical adenopathy.  Skin:    General: Skin is warm and dry.  Neurological:     General: No focal deficit present.     Mental Status: She is alert and oriented to person, place, and time.     Cranial Nerves: No cranial nerve deficit.     Coordination: Coordination normal.     Gait: Gait normal.  Psychiatric:        Mood and Affect: Mood normal.        Behavior: Behavior normal.        Thought Content: Thought content normal.        Judgment: Judgment normal.       Assessment & Plan:   Problem List Items Addressed This Visit       Other   Urinary frequency - Primary    UA showed trace leukocytes, will add on urine culture.  Will also check A1c today.  Symptoms may be related to overactive bladder.  Discussed doing Kegel exercises several times per day.  Limit fluids 2 hours before bedtime.  If labs are negative and still having ongoing symptoms, discussed that there are medications to help treat overactive bladder.  Follow-up in 3 months, sooner with concerns.      Relevant Orders   Hemoglobin A1c   POCT urinalysis dipstick (Completed)   Urine  Culture   Other Visit Diagnoses     Fatigue, unspecified type       Check CMP, CBC, TSH, iron panel, and vitamin D. Nocturia may be causing daytime fatigue.   Relevant Orders   CBC   Comprehensive metabolic panel    Iron, TIBC and Ferritin Panel   TSH   VITAMIN D 25 Hydroxy (Vit-D Deficiency, Fractures)        Follow up plan: Return in about 3 months (around 01/15/2023) for CPE.

## 2022-10-15 NOTE — Assessment & Plan Note (Signed)
UA showed trace leukocytes, will add on urine culture.  Will also check A1c today.  Symptoms may be related to overactive bladder.  Discussed doing Kegel exercises several times per day.  Limit fluids 2 hours before bedtime.  If labs are negative and still having ongoing symptoms, discussed that there are medications to help treat overactive bladder.  Follow-up in 3 months, sooner with concerns.

## 2022-10-16 LAB — URINE CULTURE
MICRO NUMBER:: 14910801
Result:: NO GROWTH
SPECIMEN QUALITY:: ADEQUATE

## 2022-10-16 LAB — IRON,TIBC AND FERRITIN PANEL
%SAT: 32 % (calc) (ref 16–45)
Ferritin: 75 ng/mL (ref 16–154)
Iron: 114 ug/dL (ref 40–190)
TIBC: 358 mcg/dL (calc) (ref 250–450)

## 2022-10-26 ENCOUNTER — Encounter: Payer: Self-pay | Admitting: Nurse Practitioner

## 2022-10-26 MED ORDER — OXYBUTYNIN CHLORIDE ER 10 MG PO TB24: 10.0000 mg | ORAL_TABLET | Freq: Every day | ORAL | 1 refills | Status: DC

## 2023-01-18 ENCOUNTER — Encounter: Payer: Medicaid Other | Admitting: Nurse Practitioner

## 2023-03-10 ENCOUNTER — Encounter: Payer: Self-pay | Admitting: Family Medicine

## 2023-03-24 ENCOUNTER — Ambulatory Visit: Payer: Medicaid Other | Admitting: Obstetrics & Gynecology

## 2023-03-24 ENCOUNTER — Encounter: Payer: Self-pay | Admitting: Obstetrics & Gynecology

## 2023-03-24 VITALS — BP 108/68 | HR 68 | Wt 144.0 lb

## 2023-03-24 DIAGNOSIS — Z01419 Encounter for gynecological examination (general) (routine) without abnormal findings: Secondary | ICD-10-CM | POA: Diagnosis not present

## 2023-03-24 DIAGNOSIS — Z30011 Encounter for initial prescription of contraceptive pills: Secondary | ICD-10-CM | POA: Diagnosis not present

## 2023-03-24 MED ORDER — NORGESTIMATE-ETH ESTRADIOL 0.25-35 MG-MCG PO TABS: 1.0000 | ORAL_TABLET | Freq: Every day | ORAL | 3 refills | Status: AC

## 2023-03-24 NOTE — Progress Notes (Signed)
GYNECOLOGY CLINIC ANNUAL PREVENTATIVE CARE ENCOUNTER NOTE  Subjective:   Diamond Jones is a 32 y.o. [phone] female here for a routine annual gynecologic exam.  Current complaints: she requests refill for OCP.   Denies abnormal vaginal bleeding, discharge, pelvic pain, problems with intercourse or other gynecologic concerns.    Gynecologic History Patient's last menstrual period was 03/04/2023 (exact date). Contraception: none Last Pap: 2022. Results were: normal Last mammogram: n/a. Results were:   Obstetric History OB History  Gravida Para Term Preterm AB Living  3 2 2  0 1 2  SAB IAB Ectopic Multiple Live Births  1 0 0 0 2    # Outcome Date GA Lbr Len/2nd Weight Sex Type Anes PTL Lv  3 Term 12/21/21 [redacted]w[redacted]d 10:07 / 00:14 7 lb 7.9 oz (3.4 kg) M Vag-Spont EPI  LIV  2 Term 09/14/20 [redacted]w[redacted]d 16:16 / 00:15 6 lb 3.1 oz (2.809 kg) M Vag-Spont EPI  LIV  1 SAB             Past Medical History:  Diagnosis Date   Medical history non-contributory     Past Surgical History:  Procedure Laterality Date   NO PAST SURGERIES      Current Outpatient Medications on File Prior to Visit  Medication Sig Dispense Refill   oxybutynin (DITROPAN XL) 10 MG 24 hr tablet Take 1 tablet (10 mg total) by mouth at bedtime. (Patient not taking: Reported on 03/24/2023) 30 tablet 1   No current facility-administered medications on file prior to visit.    No Known Allergies  Social History   Socioeconomic History   Marital status: Single    Spouse name: Not on file   Number of children: Not on file   Years of education: Not on file   Highest education level: Not on file  Occupational History   Not on file  Tobacco Use   Smoking status: Never   Smokeless tobacco: Never  Vaping Use   Vaping status: Never Used  Substance and Sexual Activity   Alcohol use: Not Currently    Comment: rare   Drug use: Never   Sexual activity: Yes    Birth control/protection: None  Other Topics Concern   Not on file   Social History Narrative   Not on file   Social Determinants of Health   Financial Resource Strain: Not on file  Food Insecurity: Not on file  Transportation Needs: Not on file  Physical Activity: Not on file  Stress: Not on file  Social Connections: Not on file  Intimate Partner Violence: Not on file    Family History  Problem Relation Age of Onset   Hypertension Mother    Diabetes Father    Hypertension Maternal Grandmother    Diabetes Maternal Grandmother    Diabetes Maternal Grandfather     The following portions of the patient's history were reviewed and updated as appropriate: allergies, current medications, past family history, past medical history, past social history, past surgical history and problem list.  Review of Systems Pertinent items are noted in HPI.   Objective:  BP 108/68   Pulse 68   Wt 144 lb (65.3 kg)   LMP 03/04/2023 (Exact Date)   Breastfeeding No   BMI 25.51 kg/m  CONSTITUTIONAL: Well-developed, well-nourished female in no acute distress.  HENT:  Normocephalic, atraumatic, External right and left ear normal. Oropharynx is clear and moist EYES: Conjunctivae and EOM are normal. Pupils are equal, round, and reactive to light. No scleral icterus.  NECK: Normal range of motion, supple, no masses.  Normal thyroid.  SKIN: Skin is warm and dry. No rash noted. Not diaphoretic. No erythema. No pallor. NEUROLGIC: Alert and oriented to person, place, and time. Normal reflexes, muscle tone coordination. No cranial nerve deficit noted. PSYCHIATRIC: Normal mood and affect. Normal behavior. Normal judgment and thought content. CARDIOVASCULAR: Normal heart rate noted, regular rhythm RESPIRATORY: Clear to auscultation bilaterally. Effort and breath sounds normal, no problems with respiration noted.    MUSCULOSKELETAL: Normal range of motion. No tenderness.  No cyanosis, clubbing, or edema.  2+ distal pulses.   Assessment:  Annual gynecologic examination    Plan:   Meds ordered this encounter  Medications   norgestimate-ethinyl estradiol (ORTHO-CYCLEN) 0.25-35 MG-MCG tablet    Sig: Take 1 tablet by mouth daily.    Dispense:  84 tablet    Refill:  3    Routine preventative health maintenance measures emphasized. Please refer to After Visit Summary for other counseling recommendations.    Scheryl Darter, MD Attending Obstetrician & Gynecologist Center for [employer], [facility]

## 2023-08-31 ENCOUNTER — Ambulatory Visit (INDEPENDENT_AMBULATORY_CARE_PROVIDER_SITE_OTHER): Payer: 59 | Admitting: Nurse Practitioner

## 2023-08-31 ENCOUNTER — Encounter: Payer: Self-pay | Admitting: Nurse Practitioner

## 2023-08-31 VITALS — BP 118/82 | HR 72 | Temp 96.9°F | Ht 63.0 in | Wt 138.6 lb

## 2023-08-31 DIAGNOSIS — Z0000 Encounter for general adult medical examination without abnormal findings: Secondary | ICD-10-CM | POA: Diagnosis not present

## 2023-08-31 DIAGNOSIS — N3281 Overactive bladder: Secondary | ICD-10-CM | POA: Diagnosis not present

## 2023-08-31 NOTE — Assessment & Plan Note (Signed)
Health maintenance reviewed and updated. Discussed nutrition, exercise. Follow-up 1 year.

## 2023-08-31 NOTE — Assessment & Plan Note (Signed)
 Chronic, stable. Stress has role. Currently doing better and only wakes up once at night to urinate. Follow-up with any concerns.

## 2023-08-31 NOTE — Patient Instructions (Signed)
 It was great to see you!  Start vitamin D 2,000 units daily.   Let's follow-up in 1 year, sooner if you have concerns.  If a referral was placed today, you will be contacted for an appointment. Please note that routine referrals can sometimes take up to 3-4 weeks to process. Please call our office if you haven't heard anything after this time frame.  Take care,  Rodman Pickle, NP

## 2023-08-31 NOTE — Progress Notes (Signed)
 BP 118/82 (BP Location: Left Arm, Patient Position: Sitting, Cuff Size: Normal)   Pulse 72   Temp (!) 96.9 F (36.1 C)   Ht 5\' 3"  (1.6 m)   Wt 138 lb 9.6 oz (62.9 kg)   LMP 08/06/2023 (Exact Date)   SpO2 99%   Breastfeeding No   BMI 24.55 kg/m    Subjective:    Patient ID: Diamond Jones, female    DOB: 1990/08/15, 33 y.o.   MRN: [id number]  CC: Chief Complaint  Patient presents with   Annual Exam    No concerns    HPI: Diamond Jones is a 33 y.o. female presenting on 08/31/2023 for comprehensive medical examination. Current medical complaints include:none  She currently lives with: spouse, 2 kids Menopausal Symptoms: no  Depression and Anxiety Screen done today and results listed below:     08/31/2023    8:32 AM 10/15/2022    1:34 PM 11/25/2021    9:53 AM 10/01/2021    9:24 AM 05/29/2021    9:50 AM  Depression screen PHQ 2/9  Decreased Interest 0 1 0 1 0  Down, Depressed, Hopeless 0 0 0 2   PHQ - 2 Score 0 1 0 3 0  Altered sleeping 1 2 0 0 2  Tired, decreased energy 0 3 3 2 2   Change in appetite 0 0 1 2 1   Feeling bad or failure about yourself  0 1 0 3 0  Trouble concentrating 0 0 0 0 0  Moving slowly or fidgety/restless 0 0 0 1 0  Suicidal thoughts 0 0 0 0 0  PHQ-9 Score 1 7 4 11 5   Difficult doing work/chores Not difficult at all Not difficult at all         08/31/2023    8:32 AM 10/15/2022    1:35 PM 11/25/2021    9:54 AM 10/01/2021    9:25 AM  GAD 7 : Generalized Anxiety Score  Nervous, Anxious, on Edge 1 1 0 2  Control/stop worrying 1 2 0 3  Worry too much - different things 1 2 0 3  Trouble relaxing 2 1 0 2  Restless 2 0 0 0  Easily annoyed or irritable 2 2 0 3  Afraid - awful might happen 0 0 0 0  Total GAD 7 Score 9 8 0 13  Anxiety Difficulty Not difficult at all       The patient does not have a history of falls. I did not complete a risk assessment for falls. A plan of care for falls was not documented.   Past Medical History:  Past Medical  History:  Diagnosis Date   Medical history non-contributory     Surgical History:  Past Surgical History:  Procedure Laterality Date   NO PAST SURGERIES      Medications:  Current Outpatient Medications on File Prior to Visit  Medication Sig   norgestimate-ethinyl estradiol (ORTHO-CYCLEN) 0.25-35 MG-MCG tablet Take 1 tablet by mouth daily.   No current facility-administered medications on file prior to visit.    Allergies:  No Known Allergies  Social History:  Social History   Socioeconomic History   Marital status: Single    Spouse name: Not on file   Number of children: Not on file   Years of education: Not on file   Highest education level: 12th grade  Occupational History   Not on file  Tobacco Use   Smoking status: Never   Smokeless tobacco: Never  Vaping Use  Vaping status: Never Used  Substance and Sexual Activity   Alcohol use: Not Currently    Comment: rare   Drug use: Never   Sexual activity: Yes    Birth control/protection: None  Other Topics Concern   Not on file  Social History Narrative   Not on file   Social Drivers of Health   Financial Resource Strain: Low Risk  (08/30/2023)   Overall Financial Resource Strain (CARDIA)    Difficulty of Paying Living Expenses: Not very hard  Food Insecurity: No Food Insecurity (08/30/2023)   Hunger Vital Sign    Worried About Running Out of Food in the Last Year: Never true    Ran Out of Food in the Last Year: Never true  Transportation Needs: No Transportation Needs (08/30/2023)   PRAPARE - Administrator, Civil Service (Medical): No    Lack of Transportation (Non-Medical): No  Physical Activity: Sufficiently Active (08/30/2023)   Exercise Vital Sign    Days of Exercise per Week: 5 days    Minutes of Exercise per Session: 90 min  Stress: Stress Concern Present (08/30/2023)   [employer] of Occupational Health - Occupational Stress Questionnaire    Feeling of Stress : To some extent   Social Connections: Socially Isolated (08/30/2023)   Social Connection and Isolation Panel [NHANES]    Frequency of Communication with Friends and Family: Once a week    Frequency of Social Gatherings with Friends and Family: Never    Attends Religious Services: Never    Diplomatic Services operational officer: No    Attends Engineer, structural: Not on file    Marital Status: Living with partner  Intimate Partner Violence: Not on file   Social History   Tobacco Use  Smoking Status Never  Smokeless Tobacco Never   Social History   Substance and Sexual Activity  Alcohol Use Not Currently   Comment: rare    Family History:  Family History  Problem Relation Age of Onset   Hypertension Mother    Diabetes Father    Hypertension Maternal Grandmother    Diabetes Maternal Grandmother    Diabetes Maternal Grandfather     Past medical history, surgical history, medications, allergies, family history and social history reviewed with patient today and changes made to appropriate areas of the chart.   Review of Systems  Constitutional: Negative.   HENT: Negative.    Eyes: Negative.   Respiratory: Negative.    Cardiovascular: Negative.   Gastrointestinal: Negative.   Genitourinary: Negative.   Musculoskeletal: Negative.   Skin: Negative.   Neurological: Negative.   Psychiatric/Behavioral: Negative.     All other ROS negative except what is listed above and in the HPI.      Objective:    BP 118/82 (BP Location: Left Arm, Patient Position: Sitting, Cuff Size: Normal)   Pulse 72   Temp (!) 96.9 F (36.1 C)   Ht 5\' 3"  (1.6 m)   Wt 138 lb 9.6 oz (62.9 kg)   LMP 08/06/2023 (Exact Date)   SpO2 99%   Breastfeeding No   BMI 24.55 kg/m   Wt Readings from Last 3 Encounters:  08/31/23 138 lb 9.6 oz (62.9 kg)  03/24/23 144 lb (65.3 kg)  10/15/22 146 lb (66.2 kg)    Physical Exam Vitals and nursing note reviewed.  Constitutional:      General: She is not in acute  distress.    Appearance: Normal appearance.  HENT:  Head: Normocephalic and atraumatic.     Right Ear: Tympanic membrane, ear canal and external ear normal.     Left Ear: Tympanic membrane, ear canal and external ear normal.     Mouth/Throat:     Mouth: Mucous membranes are moist.     Pharynx: No posterior oropharyngeal erythema.  Eyes:     Conjunctiva/sclera: Conjunctivae normal.  Cardiovascular:     Rate and Rhythm: Normal rate and regular rhythm.     Pulses: Normal pulses.     Heart sounds: Normal heart sounds.  Pulmonary:     Effort: Pulmonary effort is normal.     Breath sounds: Normal breath sounds.  Abdominal:     Palpations: Abdomen is soft.     Tenderness: There is no abdominal tenderness.  Musculoskeletal:        General: Normal range of motion.     Cervical back: Normal range of motion and neck supple.     Right lower leg: No edema.     Left lower leg: No edema.  Lymphadenopathy:     Cervical: No cervical adenopathy.  Skin:    General: Skin is warm and dry.  Neurological:     General: No focal deficit present.     Mental Status: She is alert and oriented to person, place, and time.     Cranial Nerves: No cranial nerve deficit.     Coordination: Coordination normal.     Gait: Gait normal.  Psychiatric:        Mood and Affect: Mood normal.        Behavior: Behavior normal.        Thought Content: Thought content normal.        Judgment: Judgment normal.     Results for orders placed or performed in visit on 10/15/22  CBC   Collection Time: 10/15/22  1:28 PM  Result Value Ref Range   WBC 5.8 4.0 - 10.5 K/uL   RBC 5.19 (H) 3.87 - 5.11 Mil/uL   Platelets 293.0 150.0 - 400.0 K/uL   Hemoglobin 15.1 (H) 12.0 - 15.0 g/dL   HCT 78.2 95.6 - 21.3 %   MCV 84.1 78.0 - 100.0 fl   MCHC 34.7 30.0 - 36.0 g/dL   RDW 08.6 57.8 - 46.9 %  Comprehensive metabolic panel   Collection Time: 10/15/22  1:28 PM  Result Value Ref Range   Sodium 139 135 - 145 mEq/L    Potassium 4.3 3.5 - 5.1 mEq/L   Chloride 104 96 - 112 mEq/L   CO2 26 19 - 32 mEq/L   Glucose, Bld 85 70 - 99 mg/dL   BUN 16 6 - 23 mg/dL   Creatinine, Ser 6.29 0.40 - 1.20 mg/dL   Total Bilirubin 0.6 0.2 - 1.2 mg/dL   Alkaline Phosphatase 49 39 - 117 U/L   AST 18 0 - 37 U/L   ALT 18 0 - 35 U/L   Total Protein 7.5 6.0 - 8.3 g/dL   Albumin 4.5 3.5 - 5.2 g/dL   GFR 528.41 >32.44 mL/min   Calcium 9.9 8.4 - 10.5 mg/dL  Hemoglobin W1U   Collection Time: 10/15/22  1:28 PM  Result Value Ref Range   Hgb A1c MFr Bld 5.2 4.6 - 6.5 %  Iron, TIBC and Ferritin Panel   Collection Time: 10/15/22  1:28 PM  Result Value Ref Range   Iron 114 40 - 190 mcg/dL   TIBC 272 536 - 644 mcg/dL (calc)   %SAT 32  16 - 45 % (calc)   Ferritin 75 16 - 154 ng/mL  TSH   Collection Time: 10/15/22  1:28 PM  Result Value Ref Range   TSH 1.65 0.35 - 5.50 uIU/mL  VITAMIN D 25 Hydroxy (Vit-D Deficiency, Fractures)   Collection Time: 10/15/22  1:28 PM  Result Value Ref Range   VITD 18.06 (L) 30.00 - 100.00 ng/mL  POCT urinalysis dipstick   Collection Time: 10/15/22  1:32 PM  Result Value Ref Range   Color, UA     Clarity, UA     Glucose, UA Negative Negative   Bilirubin, UA Negative    Ketones, UA Negative    Spec Grav, UA 1.015 1.010 - 1.025   Blood, UA Negative    pH, UA 6.0 5.0 - 8.0   Protein, UA Negative Negative   Urobilinogen, UA 0.2 0.2 or 1.0 E.U./dL   Nitrite, UA Negative    Leukocytes, UA Trace (A) Negative   Appearance     Odor    Urine Culture   Collection Time: 10/15/22  1:38 PM   Specimen: Urine  Result Value Ref Range   MICRO NUMBER: [id number]    SPECIMEN QUALITY: Adequate    Sample Source URINE    STATUS: FINAL    Result: No Growth       Assessment & Plan:   Problem List Items Addressed This Visit       Genitourinary   Overactive bladder   Chronic, stable. Stress has role. Currently doing better and only wakes up once at night to urinate. Follow-up with any concerns.          Other   Routine general medical examination at a health care facility - Primary   Health maintenance reviewed and updated. Discussed nutrition, exercise. Follow-up 1 year.          Follow up plan: Return in about 1 year (around 08/30/2024) for CPE.   LABORATORY TESTING:  - Pap smear: up to date  IMMUNIZATIONS:   - Tdap: Tetanus vaccination status reviewed: last tetanus booster within 10 years. - Influenza: Declined - Pneumovax: Not applicable - Prevnar: Not applicable - HPV: Not applicable - Shingrix vaccine: Not applicable  SCREENING: -Mammogram: Not applicable  - Colonoscopy: Not applicable  - Bone Density: Not applicable   PATIENT COUNSELING:   Advised to take 1 mg of folate supplement per day if capable of pregnancy.   Sexuality: Discussed sexually transmitted diseases, partner selection, use of condoms, avoidance of unintended pregnancy  and contraceptive alternatives.   Advised to avoid cigarette smoking.  I discussed with the patient that most people either abstain from alcohol or drink within safe limits (<=14/week and <=4 drinks/occasion for males, <=7/weeks and <= 3 drinks/occasion for females) and that the risk for alcohol disorders and other health effects rises proportionally with the number of drinks per week and how often a drinker exceeds daily limits.  Discussed cessation/primary prevention of drug use and availability of treatment for abuse.   Diet: Encouraged to adjust caloric intake to maintain  or achieve ideal body weight, to reduce intake of dietary saturated fat and total fat, to limit sodium intake by avoiding high sodium foods and not adding table salt, and to maintain adequate dietary potassium and calcium preferably from fresh fruits, vegetables, and low-fat dairy products.    stressed the importance of regular exercise  Injury prevention: Discussed safety belts, safety helmets, smoke detector, smoking near bedding or upholstery.   Dental  health: Discussed importance  of regular tooth brushing, flossing, and dental visits.    NEXT PREVENTATIVE PHYSICAL DUE IN 1 YEAR. Return in about 1 year (around 08/30/2024) for CPE.  Pema Thomure A Omaya Nieland
